# Patient Record
Sex: Female | Born: 1980 | Race: White | Hispanic: No | Marital: Married | Smoking: Former smoker
Health system: Southern US, Community
[De-identification: ages and names within clinical notes are randomized; demographics above are authoritative.]

## PROBLEM LIST (undated history)

## (undated) ENCOUNTER — Inpatient Hospital Stay: Payer: Self-pay

## (undated) DIAGNOSIS — R87629 Unspecified abnormal cytological findings in specimens from vagina: Secondary | ICD-10-CM

## (undated) HISTORY — DX: Unspecified abnormal cytological findings in specimens from vagina: R87.629

## (undated) HISTORY — PX: FRACTURE SURGERY: SHX138

## (undated) HISTORY — PX: WISDOM TOOTH EXTRACTION: SHX21

---
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
## 2002-11-24 ENCOUNTER — Ambulatory Visit (HOSPITAL_BASED_OUTPATIENT_CLINIC_OR_DEPARTMENT_OTHER): Admission: RE | Admit: 2002-11-24 | Discharge: 2002-11-24 | Payer: Self-pay | Admitting: Obstetrics and Gynecology

## 2005-06-04 ENCOUNTER — Ambulatory Visit: Payer: Self-pay | Admitting: Family Medicine

## 2007-01-03 ENCOUNTER — Ambulatory Visit: Payer: Self-pay | Admitting: Internal Medicine

## 2007-01-24 ENCOUNTER — Ambulatory Visit: Payer: Self-pay | Admitting: Family Medicine

## 2007-01-31 ENCOUNTER — Telehealth (INDEPENDENT_AMBULATORY_CARE_PROVIDER_SITE_OTHER): Payer: Self-pay | Admitting: *Deleted

## 2007-01-31 ENCOUNTER — Emergency Department (HOSPITAL_COMMUNITY): Admission: EM | Admit: 2007-01-31 | Discharge: 2007-01-31 | Payer: Self-pay | Admitting: Family Medicine

## 2007-06-26 ENCOUNTER — Emergency Department (HOSPITAL_COMMUNITY): Admission: EM | Admit: 2007-06-26 | Discharge: 2007-06-26 | Payer: Self-pay | Admitting: Emergency Medicine

## 2007-07-22 ENCOUNTER — Ambulatory Visit: Payer: Self-pay | Admitting: Family Medicine

## 2007-07-27 ENCOUNTER — Telehealth (INDEPENDENT_AMBULATORY_CARE_PROVIDER_SITE_OTHER): Payer: Self-pay | Admitting: Internal Medicine

## 2007-09-07 ENCOUNTER — Ambulatory Visit: Payer: Self-pay | Admitting: Family Medicine

## 2007-09-12 ENCOUNTER — Encounter: Payer: Self-pay | Admitting: Family Medicine

## 2007-09-27 ENCOUNTER — Ambulatory Visit: Payer: Self-pay | Admitting: Family Medicine

## 2007-09-28 ENCOUNTER — Encounter: Payer: Self-pay | Admitting: Family Medicine

## 2008-07-13 ENCOUNTER — Ambulatory Visit: Payer: Self-pay | Admitting: Family Medicine

## 2008-08-10 ENCOUNTER — Ambulatory Visit: Payer: Self-pay | Admitting: Internal Medicine

## 2008-08-21 ENCOUNTER — Ambulatory Visit: Payer: Self-pay | Admitting: Family Medicine

## 2008-09-10 ENCOUNTER — Telehealth: Payer: Self-pay | Admitting: Family Medicine

## 2008-09-20 ENCOUNTER — Ambulatory Visit: Payer: Self-pay | Admitting: Family Medicine

## 2008-09-20 LAB — CONVERTED CEMR LAB
Ketones, urine, test strip: NEGATIVE
Nitrite: NEGATIVE
Protein, U semiquant: 30

## 2009-09-20 ENCOUNTER — Emergency Department (HOSPITAL_COMMUNITY): Admission: EM | Admit: 2009-09-20 | Discharge: 2009-09-20 | Payer: Self-pay | Admitting: Emergency Medicine

## 2009-12-31 ENCOUNTER — Ambulatory Visit: Payer: Self-pay | Admitting: Family Medicine

## 2010-03-12 ENCOUNTER — Emergency Department (HOSPITAL_COMMUNITY): Admission: EM | Admit: 2010-03-12 | Discharge: 2010-03-12 | Payer: Self-pay | Admitting: Emergency Medicine

## 2010-07-15 ENCOUNTER — Encounter: Payer: Self-pay | Admitting: Family Medicine

## 2010-07-15 ENCOUNTER — Ambulatory Visit
Admission: RE | Admit: 2010-07-15 | Discharge: 2010-07-15 | Payer: Self-pay | Source: Home / Self Care | Attending: Family Medicine | Admitting: Family Medicine

## 2010-07-15 LAB — CONVERTED CEMR LAB: Rapid Strep: NEGATIVE

## 2010-07-15 NOTE — Assessment & Plan Note (Signed)
Summary: possible strep throat/jbb   Vital Signs:  Patient Profile:   30 Years Old Female CC:      Sore Throat / rwt Height:     66 inches Weight:      152 pounds BMI:     24.62 O2 Sat:      100 % O2 treatment:    Room Air Temp:     97.6 degrees F oral Pulse rate:   63 / minute Pulse rhythm:   regular Resp:     18 per minute BP sitting:   133 / 72  (right arm)  Pt. in pain?   yes    Location:   neck    Intensity:   4    Type:       stinging  Vitals Entered By: Levonne Spiller EMT-P (December 31, 2009 2:27 PM)              Is Patient Diabetic? No Comments Pt. is a smoker.  1 pack per week.      Current Allergies: ! * Z-PACK  History of Present Illness History from: patient Reason for visit: see chief complaint Chief Complaint: Sore Throat / rwt History of Present Illness: For 1-2 days patient has been complaining of a sore throat[place] be [place] down her throat. She denies drainage now. No cough, other than smoker's cough. No f/c, rhinorrhea.  REVIEW OF SYSTEMS Constitutional Symptoms      Denies fever, chills, night sweats, weight loss, weight gain, and fatigue.  Ear/Nose/Throat/Mouth       Complains of sore throat.      Denies ear pain, dizziness, frequent runny nose, frequent nose bleeds, sinus problems, and hoarseness.  Respiratory       Complains of productive cough.      Denies dry cough, wheezing, and shortness of breath.     Neurological       Denies headaches.    Social History: Marital Status:single Children:  Occupation: Runner, broadcasting/film/video Physical Exam General appearance: well developed, well nourished, no acute distress Oral/Pharynx: pharyngeal erythema with exudate, uvula midline without deviation + tonsillar enlargement bilaterally with erythema Neck: neck supple,  trachea midline, no masses, shotty lymphadenopathy (nontender) Chest/Lungs: no rales, wheezes, or rhonchi bilateral, breath sounds equal without  effort MSE: oriented to time, place, and person Assessment New Problems: PHARYNGITIS-ACUTE (ICD-462)   The patient and/or caregiver has been counseled thoroughly with regard to medications prescribed including dosage, schedule, interactions, rationale for use, and possible side effects and they verbalize understanding.  Diagnoses and expected course of recovery discussed and will return if not improved as expected or if the condition worsens. Patient and/or caregiver verbalized understanding.   Patient Instructions: 1)  For sore throat you may try... 2)  1- Ibuprofen or acetaminophen in liquid form 3)  2- warm salt water gargles - 1 tsp salt in quart of warm water 4)  3- eat smooth, wet, slippery foods such as ice cream, or jello. 5)  4- drinking herbal tea - ex. "Throat coat" 6)  Stay away from pain relief sprays and lozenges that contain medicines like benzocaine, ex. Chloraseptic, as they sometimes cause worsening pain after use. 7)  If severe - gargle with 25mg  of diphenhydramine (Benadryl) up to 4x/day or Maalox 10ml before meals  Orders Added: 1)  Est. Patient Level III [18841] 2)  Rapid Strep [66063]  The risks, benefits and possible side effects of the treatments and  tests were explained clearly to the patient and the patient verbalized understanding.  The patient was informed that there is no on-call provider or services available at this clinic during off-hours (when the clinic is closed).  If the patient developed a problem or concern that required immediate attention, the patient was advised to go the the nearest available urgent care or emergency department for medical care.  The patient verbalized understanding.

## 2010-07-23 ENCOUNTER — Ambulatory Visit (INDEPENDENT_AMBULATORY_CARE_PROVIDER_SITE_OTHER): Payer: BC Managed Care – PPO | Admitting: Family Medicine

## 2010-07-23 ENCOUNTER — Encounter: Payer: Self-pay | Admitting: Family Medicine

## 2010-07-23 DIAGNOSIS — J069 Acute upper respiratory infection, unspecified: Secondary | ICD-10-CM

## 2010-07-23 DIAGNOSIS — Z87891 Personal history of nicotine dependence: Secondary | ICD-10-CM | POA: Insufficient documentation

## 2010-07-23 NOTE — Assessment & Plan Note (Signed)
Summary: Natalie Ray patient/strep/rbh   Vital Signs:  Patient profile:   30 year old female Height:      66 inches Weight:      170.75 pounds BMI:     27.66 Temp:     98.4 degrees F oral Pulse rate:   84 / minute Pulse rhythm:   regular BP sitting:   102 / 62  (left arm) Cuff size:   regular  Vitals Entered By: Delilah Shan CMA Awab Abebe Dull) (July 15, 2010 8:44 AM) CC: ? Strep   History of Present Illness: Cold symptoms.  Started yesterday.  Came home from school yesterday.  ST.  Fatigued.  Post nasal gtt.  neck is sore from swallowing.  Taking tylenol cold and sinus (some temp relief), mucinex, and advil.  Chloroseptic and cough drops have helped some.  Recent sick contacts with strep.   Fever last night with chills alternated.  No cough.    Allergies: 1)  ! * Z-Pack  Past History:  Social History: Last updated: 07/15/2010 Marital Status:single Children: no Occupation: Runner, broadcasting/film/video, Barrister's clerk, [employer] grad enjoys rodeos, hunting, coaches volleyball quit smoking 2012 occ alcohol on weekends no illicits  Past Medical History: otherwise healthy Dr. Elana Alm for gyn care  Family History: Reviewed history from 09/12/2007 and no changes required. Father:alive DM Mother: alive Siblings: 1 brother, healthy  Social History: Reviewed history from 12/31/2009 and no changes required. Marital Status:single Children: no Occupation: Runner, broadcasting/film/video, Barrister's clerk, [employer] grad enjoys rodeos, hunting, coaches volleyball quit smoking 2012 occ alcohol on weekends no illicits  Review of Systems       See HPI.  Otherwise negative.    Physical Exam  General:  GEN: nad, alert and oriented HEENT: mucous membranes moist, TM w/o erythema, nasal epithelium minimally injected, OP with cobblestoning but no erythema/exudates NECK: supple w/o LA, no mass CV: rrr. PULM: ctab, no inc wob EXT: no edema    Impression & Recommendations:  Problem # 1:  PHARYNGITIS-ACUTE  (ICD-462) likely viral.  Supportive tx.  Nontoxic.  RST neg.  Okay to follow up as needed.  d/w patient and she agrees.  note for work given.   The following medications were removed from the medication list:    Septra Ds 800-160 Mg Tabs (Sulfamethoxazole-trimethoprim) .Marland Kitchen... 1 two times a day x 5days  Complete Medication List: 1)  Multivitamins Tabs (Multiple vitamin) .... Take 1 tablet by mouth once a day 2)  Sprintec 28 0.25-35 Mg-mcg Tabs (Norgestimate-eth estradiol) .... As directed  Other Orders: Rapid Strep (16109)  Patient Instructions: 1)  Get plenty of rest, drink lots of clear liquids, and use Tylenol or Ibuprofen for fever and comfort. This should graually improve.  Gargle with warm salt water and try to rest your voice.  Back to work when fever resolved.    Orders Added: 1)  Rapid Strep [60454] 2)  Est. Patient Level III [09811]    Current Allergies (reviewed today): ! * Z-PACK  Laboratory Results  Date/Time Received: July 15, 2010 8:51 AM   Other Tests  Rapid Strep: negative

## 2010-07-23 NOTE — Letter (Signed)
Summary: Out of Work  [facility]  [address] [place], [place] [address]   Phone: [phone]  Fax: [phone]    July 15, 2010   Employee:  Rosiland Oz    To Whom It May Concern:   For Medical reasons, please excuse the above named employee from work for the following dates:  Start:   07/14/10  End:   Return to work when fever and sore throat resolved, potentially contagious.   If you need additional information, please feel free to contact our office.         Sincerely,    Crawford Givens MD
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
## 2010-10-31 NOTE — Op Note (Signed)
   Natalie Ray, Natalie Ray                         ACCOUNT NO.:  [id number]   MEDICAL RECORD NO.:  [id number]                   PATIENT TYPE:  AMB   LOCATION:  [facility]                                 FACILITY:  [facility]   PHYSICIAN:  Katherine Roan, M.D.               DATE OF BIRTH:  1981/01/04   DATE OF PROCEDURE:  DATE OF DISCHARGE:                                 OPERATIVE REPORT   PREOPERATIVE DIAGNOSIS:  Moderate dysplasia of endocervix.   POSTOPERATIVE DIAGNOSIS:  Moderate dysplasia of endocervix.   OPERATION:  CO2 laser of cervix.   The patient was placed in lithotomy position, prepped and draped in the  usual fashion. The cervix was then washed with acetic acid and using the  Green filter, I was able to do endocervical and exocervical ablation of the  cervical tissue. The depth of invasion was 7 mm. [name] tolerated this  procedure well with minimal blood loss and was sent to the recovery room in  good condition.                                               Katherine Roan, M.D.    SDM/MEDQ  D:  11/24/2002  T:  11/25/2002  Job:  [id number]

## 2011-01-28 ENCOUNTER — Encounter: Payer: Self-pay | Admitting: *Deleted

## 2011-01-28 ENCOUNTER — Encounter: Payer: Self-pay | Admitting: Family Medicine

## 2011-01-28 ENCOUNTER — Ambulatory Visit (INDEPENDENT_AMBULATORY_CARE_PROVIDER_SITE_OTHER): Payer: BC Managed Care – PPO | Admitting: Family Medicine

## 2011-01-28 VITALS — BP 120/72 | HR 88 | Temp 97.8°F | Ht 68.0 in | Wt 169.8 lb

## 2011-01-28 DIAGNOSIS — B349 Viral infection, unspecified: Secondary | ICD-10-CM

## 2011-01-28 DIAGNOSIS — B9789 Other viral agents as the cause of diseases classified elsewhere: Secondary | ICD-10-CM

## 2011-01-28 DIAGNOSIS — J029 Acute pharyngitis, unspecified: Secondary | ICD-10-CM

## 2011-01-28 NOTE — Progress Notes (Signed)
  Subjective:    Patient ID: Natalie Ray, female    DOB: Jan 04, 1981, 30 y.o.   MRN: [id number]  HPI  Natalie Ray, a 30 y.o. 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 No n/v/d. No fever or chills. No headache.  Pt did have some Augmentin  From last year that she started on Monday  The PMH, PSH, Social History, Family History, Medications, and allergies have been reviewed in [facility], and have been updated if relevant.  Review of Systems ROS: GEN: Acute illness details above GI: Tolerating PO intake GU: maintaining adequate hydration and urination Pulm: No SOB Interactive and getting along well at home.  Otherwise, ROS is as per the HPI.     Objective:   Physical Exam   Physical Exam  Blood pressure 120/72, pulse 88, temperature 97.8 F (36.6 C), temperature source Oral, height 5\' 8"  (1.727 m), weight 169 lb 12.8 Ray (77.021 kg), SpO2 99.00%.  GEN: A and O x 3. WDWN. NAD.    ENT: Nose clear, ext NML.  No LAD.  No JVD.  TM's clear. Oropharynx clear.  PULM: Normal WOB, no distress. No crackles, wheezes, rhonchi. CV: RRR, no M/G/R, No rubs, No JVD.   ABD: S, NT, ND, + BS. No rebound. No guarding. No HSM.   EXT: warm and well-perfused, No c/c/e. PSYCH: Pleasant and conversant.       Assessment & Plan:   1. Viral syndrome    2. Sore throat  POCT rapid strep A, POCT rapid strep A   Strep neg Reassured. Supportive care reviewed with patient. Clinically c/w URI -- would stop Augmentin

## 2011-02-16 ENCOUNTER — Emergency Department (HOSPITAL_COMMUNITY)
Admission: EM | Admit: 2011-02-16 | Discharge: 2011-02-16 | Discharge status: Against Medical Advice | Payer: BC Managed Care – PPO | Attending: Emergency Medicine | Admitting: Emergency Medicine

## 2011-02-16 DIAGNOSIS — R51 Headache: Secondary | ICD-10-CM | POA: Insufficient documentation

## 2011-04-02 ENCOUNTER — Other Ambulatory Visit (HOSPITAL_COMMUNITY)
Admission: RE | Admit: 2011-04-02 | Discharge: 2011-04-02 | Disposition: A | Discharge status: Home / Self Care | Payer: BC Managed Care – PPO | Source: Ambulatory Visit | Attending: Family Medicine | Admitting: Family Medicine

## 2011-04-02 ENCOUNTER — Encounter: Payer: Self-pay | Admitting: Family Medicine

## 2011-04-02 ENCOUNTER — Ambulatory Visit (INDEPENDENT_AMBULATORY_CARE_PROVIDER_SITE_OTHER): Payer: BC Managed Care – PPO | Admitting: Family Medicine

## 2011-04-02 VITALS — BP 120/72 | HR 80 | Temp 98.2°F | Ht 67.0 in | Wt 173.8 lb

## 2011-04-02 DIAGNOSIS — Z0000 Encounter for general adult medical examination without abnormal findings: Secondary | ICD-10-CM

## 2011-04-02 DIAGNOSIS — Z01419 Encounter for gynecological examination (general) (routine) without abnormal findings: Secondary | ICD-10-CM

## 2011-04-02 DIAGNOSIS — Z113 Encounter for screening for infections with a predominantly sexual mode of transmission: Secondary | ICD-10-CM | POA: Insufficient documentation

## 2011-04-02 DIAGNOSIS — Z136 Encounter for screening for cardiovascular disorders: Secondary | ICD-10-CM

## 2011-04-02 MED ORDER — NORGESTIMATE-ETH ESTRADIOL 0.25-35 MG-MCG PO TABS: 1.0000 | ORAL_TABLET | Freq: Every day | ORAL | Status: DC

## 2011-04-02 NOTE — Patient Instructions (Signed)
Very nice to meet you. We will call you with results of your blood work and test results next week. Have a great weekend.
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
## 2011-04-03 LAB — LIPID PANEL
Cholesterol: 228 mg/dL — ABNORMAL HIGH (ref 0–200)
HDL: 60.1 mg/dL
Total CHOL/HDL Ratio: 4
Triglycerides: 181 mg/dL — ABNORMAL HIGH (ref 0.0–149.0)
VLDL: 36.2 mg/dL (ref 0.0–40.0)

## 2011-04-03 LAB — BASIC METABOLIC PANEL
BUN: 12 mg/dL (ref 6–23)
CO2: 26 mEq/L (ref 19–32)
Calcium: 9.2 mg/dL (ref 8.4–10.5)
Glucose, Bld: 81 mg/dL (ref 70–99)
Potassium: 4.6 mEq/L (ref 3.5–5.1)
Sodium: 139 mEq/L (ref 135–145)

## 2011-04-03 LAB — SYPHILIS: RPR W/REFLEX TO RPR TITER AND TREPONEMAL ANTIBODIES, TRADITIONAL SCREENING AND DIAGNOSIS ALGORITHM

## 2011-04-03 LAB — LDL CHOLESTEROL, DIRECT: Direct LDL: 149.6 mg/dL

## 2011-04-06 ENCOUNTER — Encounter: Payer: Self-pay | Admitting: *Deleted

## 2011-12-21 ENCOUNTER — Other Ambulatory Visit: Payer: Self-pay | Admitting: *Deleted

## 2011-12-21 MED ORDER — NORGESTIMATE-ETH ESTRADIOL 0.25-35 MG-MCG PO TABS: 1.0000 | ORAL_TABLET | Freq: Every day | ORAL | Status: DC

## 2012-04-08 ENCOUNTER — Other Ambulatory Visit: Payer: Self-pay | Admitting: Family Medicine

## 2012-05-23 ENCOUNTER — Telehealth: Payer: Self-pay | Admitting: Family Medicine

## 2012-05-23 ENCOUNTER — Ambulatory Visit (INDEPENDENT_AMBULATORY_CARE_PROVIDER_SITE_OTHER): Payer: BC Managed Care – PPO | Admitting: Family Medicine

## 2012-05-23 ENCOUNTER — Encounter: Payer: Self-pay | Admitting: Family Medicine

## 2012-05-23 VITALS — BP 102/72 | HR 63 | Temp 98.5°F | Wt 180.8 lb

## 2012-05-23 DIAGNOSIS — Z136 Encounter for screening for cardiovascular disorders: Secondary | ICD-10-CM

## 2012-05-23 DIAGNOSIS — Z0000 Encounter for general adult medical examination without abnormal findings: Secondary | ICD-10-CM

## 2012-05-23 DIAGNOSIS — R059 Cough, unspecified: Secondary | ICD-10-CM

## 2012-05-23 DIAGNOSIS — R05 Cough: Secondary | ICD-10-CM

## 2012-05-23 MED ORDER — ALBUTEROL SULFATE (2.5 MG/3ML) 0.083% IN NEBU
2.5000 mg | INHALATION_SOLUTION | Freq: Once | RESPIRATORY_TRACT | Status: AC
  Administered 2012-05-23: 2.5 mg via RESPIRATORY_TRACT

## 2012-05-23 MED ORDER — NORGESTIMATE-ETH ESTRADIOL 0.25-35 MG-MCG PO TABS: ORAL_TABLET | ORAL | Status: DC

## 2012-05-23 MED ORDER — ALBUTEROL SULFATE HFA 108 (90 BASE) MCG/ACT IN AERS: 1.0000 | INHALATION_SPRAY | Freq: Four times a day (QID) | RESPIRATORY_TRACT | Status: DC | PRN

## 2012-05-23 NOTE — Telephone Encounter (Signed)
Needs OCP refill.  Will defer to Dr Dayton Martes.  Thanks.

## 2012-05-23 NOTE — Telephone Encounter (Signed)
Rx for one month sent. Ok to refill for year from my standpoint as she is not yet due for another pap (had normal pap in October 2012).  May need CPX with primary.  Will defer to Dr. Para March for that.Marland Kitchen

## 2012-05-23 NOTE — Telephone Encounter (Signed)
Left message asking patient to call back

## 2012-05-23 NOTE — Telephone Encounter (Signed)
I am not her primary.  Hadn't seen [facility] in a while, pt identified Natalie Ray as main MD as Natalie Ray had done CPE.

## 2012-05-23 NOTE — Progress Notes (Signed)
duration of symptoms: 4-5 days.   Started with nasal congestion.  Smokes.   Voice is altered.  Some sputum in AM, none after taking mucinex about 9AM Rhinorrhea: no No fevers.   ear pain: no sore throat: yes, likely from cough Cough:yes Myalgias: yes, likely from the cough.   ROS: See HPI.  Otherwise negative.    Meds, vitals, and allergies reviewed.   GEN: nad, alert and oriented HEENT: mucous membranes moist, TM w/o erythema, nasal epithelium not injected, OP without cobblestoning NECK: supple w/o LA CV: rrr. PULM: ctab, no inc wob, dry cough noted, no wheeze EXT: no edema  SABA neb given in office.    Recheck ctab with dec in cough noted.

## 2012-05-23 NOTE — Telephone Encounter (Signed)
That is fine for me- please change PCP in the chart

## 2012-05-23 NOTE — Telephone Encounter (Signed)
That's odd- when you look at my note from October she identified as a Para March pt and we were just doing her pap. Jacki Cones, please add me as PCP and please have her come in for lab work at her convenience.  I will place those labs.

## 2012-05-23 NOTE — Telephone Encounter (Signed)
Spoke with patient.  She is asking to change from Dr. Milinda Antis to Dr. Dayton Martes. I advised her she will need to schedule get established visit with Dr. Dayton Martes.

## 2012-05-23 NOTE — Patient Instructions (Addendum)
Use the inhaler every 6 hours as needed.  Stay off the cigarettes.  Drink plenty of fluids, gargle with salt water, and use a throat lozenge through the day.  I would get a flu shot each fall.

## 2012-05-24 DIAGNOSIS — R05 Cough: Secondary | ICD-10-CM | POA: Insufficient documentation

## 2012-05-24 DIAGNOSIS — R059 Cough, unspecified: Secondary | ICD-10-CM | POA: Insufficient documentation

## 2012-05-24 NOTE — Assessment & Plan Note (Signed)
Likely viral, nontoxic, use SABA with routine instructions given and f/u prn.  Should resolve.

## 2012-05-25 NOTE — Telephone Encounter (Signed)
Left message asking patient to call back, PCP changed in chart.

## 2012-05-26 NOTE — Telephone Encounter (Signed)
Spoke with patient, appt made

## 2012-05-31 ENCOUNTER — Other Ambulatory Visit: Payer: Self-pay | Admitting: Family Medicine

## 2012-05-31 MED ORDER — NORGESTIMATE-ETH ESTRADIOL 0.25-35 MG-MCG PO TABS: ORAL_TABLET | ORAL | Status: DC

## 2012-05-31 NOTE — Telephone Encounter (Signed)
RX  For [employer] 30 sent to [facility] [address].

## 2012-06-13 ENCOUNTER — Encounter: Payer: Self-pay | Admitting: Family Medicine

## 2012-06-13 ENCOUNTER — Ambulatory Visit (INDEPENDENT_AMBULATORY_CARE_PROVIDER_SITE_OTHER): Payer: BC Managed Care – PPO | Admitting: Family Medicine

## 2012-06-13 VITALS — BP 144/80 | HR 98 | Temp 99.0°F | Wt 184.8 lb

## 2012-06-13 DIAGNOSIS — R05 Cough: Secondary | ICD-10-CM

## 2012-06-13 DIAGNOSIS — R059 Cough, unspecified: Secondary | ICD-10-CM

## 2012-06-13 MED ORDER — BENZONATATE 200 MG PO CAPS: 200.0000 mg | ORAL_CAPSULE | Freq: Three times a day (TID) | ORAL | Status: AC | PRN

## 2012-06-13 MED ORDER — HYDROCODONE-HOMATROPINE 5-1.5 MG/5ML PO SYRP: 5.0000 mL | ORAL_SOLUTION | Freq: Three times a day (TID) | ORAL | Status: DC | PRN

## 2012-06-13 NOTE — Patient Instructions (Addendum)
Tessalon and/or hycodan for cough.  Hycodan can make you drowsy.  Drink plenty of fluids, take tylenol as needed, and this should gradually improve.  Take care.  Let us know if you have other concerns.

## 2012-06-13 NOTE — Progress Notes (Signed)
Last note reviewed.  Since last OV, she did improve and felt well by the 20th.  Cough was much improved and was sleeping through the night.  Sx restarted on Saturday.  Inc in cough, fever noted at 101.  Hot and cold chills.  Sweats.  Aches all over, mainly on the trunk, likely from coughing.  Not stuffy but some rhinorrhea.  Cough is dry, comes on in fits.  Sleep is disrupted.  Waking up coughing.    Meds, vitals, and allergies reviewed.   ROS: See HPI.  Otherwise, noncontributory.  GEN: nad, alert and oriented HEENT: mucous membranes moist, tm w/o erythema, nasal exam w/o erythema, clear discharge noted,  OP with cobblestoning, sinuses not ttp NECK: supple w/o LA CV: rrr.   PULM: ctab but dry cough noted, no inc wob EXT: no edema SKIN: no acute rash

## 2012-06-14 ENCOUNTER — Telehealth: Payer: Self-pay | Admitting: Family Medicine

## 2012-06-14 MED ORDER — PROMETHAZINE HCL 25 MG PO TABS: 12.5000 mg | ORAL_TABLET | Freq: Three times a day (TID) | ORAL | Status: DC | PRN

## 2012-06-14 NOTE — Telephone Encounter (Signed)
Caller: Armanie/Patient; Phone: [phone]; Reason for Call:Had a message from [place] and verifying if she was calling to tell her the medication was called in. Reviewed EMR note in EPIC and relayed instructions per Dr. Para March.

## 2012-06-14 NOTE — Telephone Encounter (Signed)
Left message on voice mail  to call back

## 2012-06-14 NOTE — Telephone Encounter (Signed)
Hello,  Yesterday I had an office visit with Dr. Para March, Wanted to follow up on a couple of new symptoms since that visit. I had not eaten anything yesterday at the time of my visit, but after getting home and wanting to get something on my stomach to take the medication, it all came back up. I cannot keep anything but water and orange juice down. Also I have been checking my temperature throughout the night, due to the fact that sleep still eludes me, and it has fluctuated from 101 at its highest to just now at 6:30 am 100.4. Don't know if this would change anything about his diagnosis, or if there is some other medication that he might can prescribe to stop the nausea/vomiting. I cannot come in to the office again even though I am out of work again today. If there is any way some one could respond via email or phone it would be very appreciated.   Also, Dr Para March, the cough medications have helped so thank you for that.   Thanks again, [employer]

## 2012-06-14 NOTE — Assessment & Plan Note (Signed)
She has >48 hours of sx.  We talked about ddx.  Lungs are clear.  It doesn't appear that she has PNA or other condition that would benefit from abx treatment.  D/w pt.  She agrees.  She could have flu, but with relatively mild sx and benign exam with sx >48h, tamiflu would likely not be very useful so we didn't check flu test.  Likely viral, supportive care. Use tessalon and cough syrup, sedation caution.  F/u prn.  She agrees. Nontoxic.

## 2012-06-14 NOTE — Telephone Encounter (Signed)
Please call pt back.  Thanks for the update.   I would continue to try to take sips of fluids.  She doesn't have to eat solids, but staying hydrated is important.  I send in some phenergan for the nausea.  It can make her drowsy.   I'm glad the cough is better.  Let me know if she has other concerns.   Thanks.

## 2012-06-16 ENCOUNTER — Ambulatory Visit (INDEPENDENT_AMBULATORY_CARE_PROVIDER_SITE_OTHER): Payer: BC Managed Care – PPO | Admitting: Family Medicine

## 2012-06-16 ENCOUNTER — Encounter: Payer: Self-pay | Admitting: Family Medicine

## 2012-06-16 VITALS — BP 110/80 | HR 84 | Temp 98.1°F | Ht 67.5 in | Wt 183.0 lb

## 2012-06-16 DIAGNOSIS — R05 Cough: Secondary | ICD-10-CM

## 2012-06-16 DIAGNOSIS — Z0000 Encounter for general adult medical examination without abnormal findings: Secondary | ICD-10-CM

## 2012-06-16 DIAGNOSIS — Z113 Encounter for screening for infections with a predominantly sexual mode of transmission: Secondary | ICD-10-CM

## 2012-06-16 DIAGNOSIS — R059 Cough, unspecified: Secondary | ICD-10-CM

## 2012-06-16 DIAGNOSIS — Z23 Encounter for immunization: Secondary | ICD-10-CM

## 2012-06-16 MED ORDER — NORGESTIMATE-ETH ESTRADIOL 0.25-35 MG-MCG PO TABS: ORAL_TABLET | ORAL | Status: DC

## 2012-06-16 NOTE — Addendum Note (Signed)
Addended by: Eliezer Bottom on: 06/16/2012 09:59 AM   Modules accepted: Orders
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
## 2012-10-21 ENCOUNTER — Other Ambulatory Visit: Payer: Self-pay | Admitting: *Deleted

## 2012-10-21 MED ORDER — NORGESTIMATE-ETH ESTRADIOL 0.25-35 MG-MCG PO TABS: ORAL_TABLET | ORAL | Status: DC

## 2013-01-10 ENCOUNTER — Telehealth: Payer: Self-pay

## 2013-01-10 NOTE — Telephone Encounter (Signed)
Pt left v/m requesting advice on how to take birth control med; pt is planning to get married in November and will be at end of menstrual cycle on day of wedding. Pt plans to skip her menstrual period this month by not taking the week of placebo pills; pt wants to know if she should skip the placebo pills this month only or should she skip placebo pills for 3 months. Pt wants to move her period up one week in November so she will have her menstrual cycle the week before she gets married.Please advise.

## 2013-01-10 NOTE — Telephone Encounter (Signed)
Just skip the placebo this month if she is trying to change when she has her period every month.

## 2013-01-11 NOTE — Telephone Encounter (Signed)
Advised patient as instructed. 

## 2013-02-03 ENCOUNTER — Telehealth: Payer: Self-pay

## 2013-02-03 MED ORDER — NORGESTIMATE-ETH ESTRADIOL 0.25-35 MG-MCG PO TABS: ORAL_TABLET | ORAL | Status: DC

## 2013-02-03 NOTE — Telephone Encounter (Signed)
Script mailed to patient.

## 2013-02-03 NOTE — Telephone Encounter (Signed)
Advised patient that we cant email her a script, will send in regular mail to her home address.

## 2013-02-03 NOTE — Telephone Encounter (Signed)
I am ok with this.

## 2013-02-03 NOTE — Telephone Encounter (Signed)
Dr Dayton Martes is my primary doctor. I am wanting to set up that my monthly prescription for birth control come through a 3 month mail in set up instead of monthly at [facility]. They are asking for a new Rx for the 3 month supply instead of the existing Rx that I have on order. Is there any way you could send me via email an Rx for the 3 month supply of my current birth control??  Thanks, Natalie Ray

## 2013-05-08 ENCOUNTER — Encounter: Payer: Self-pay | Admitting: Family Medicine

## 2013-05-08 ENCOUNTER — Ambulatory Visit (INDEPENDENT_AMBULATORY_CARE_PROVIDER_SITE_OTHER): Payer: 59 | Admitting: Family Medicine

## 2013-05-08 VITALS — BP 96/64 | HR 92 | Temp 98.2°F | Ht 67.5 in | Wt 182.5 lb

## 2013-05-08 DIAGNOSIS — R112 Nausea with vomiting, unspecified: Secondary | ICD-10-CM

## 2013-05-08 LAB — POCT URINE PREGNANCY: Preg Test, Ur: NEGATIVE

## 2013-05-08 MED ORDER — ONDANSETRON 8 MG PO TBDP: 8.0000 mg | ORAL_TABLET | Freq: Three times a day (TID) | ORAL | Status: DC | PRN

## 2013-05-08 MED ORDER — ONDANSETRON 4 MG PO TBDP
4.0000 mg | ORAL_TABLET | Freq: Once | ORAL | Status: AC
  Administered 2013-05-08: 4 mg via ORAL

## 2013-05-08 NOTE — Progress Notes (Signed)
Pre-visit discussion using our clinic review tool. No additional management support is needed unless otherwise documented below in the visit note.  

## 2013-05-08 NOTE — Progress Notes (Signed)
Date:  05/08/2013   Name:  Natalie Ray   DOB:  [birth year]   MRN:  [id number] Gender: female Age: 32 y.o.  Primary Physician:  Natalie Mannan, MD   Chief Complaint: Nausea and Emesis   Subjective:   History of Present Illness:  Natalie Ray is a 32 y.o. very pleasant female patient who presents with the following:  Felt fine yesterday, has been throwing up all night. Had some cold chills and sweats.  Just got married. Been throwing up for a lot of the last 24 hours. She did get to a concert on Saturday night. She has not had any diarrhea. She is not in acute abdominal pain and does not have a history of abdominal surgery.  She is sexually active, and her last menstrual period ended over the weekend.  Past Medical History, Surgical History, Social History, Family History, Problem List, Medications, and Allergies have been reviewed and updated if relevant.  Review of Systems:  GEN: no fevers, chills. GI: above  Pulm: No SOB Interactive and getting along well at home.  Otherwise, ROS is as per the HPI.  Objective:   Physical Examination: BP 96/64  Pulse 92  Temp(Src) 98.2 F (36.8 C) (Oral)  Ht 5' 7.5" (1.715 m)  Wt 182 lb 8 oz (82.781 kg)  BMI 28.15 kg/m2  LMP 05/03/2013   GEN: WDWN, NAD, Non-toxic, A & O x 3 HEENT: Atraumatic, Normocephalic. Neck supple. No masses, No LAD. Ears and Nose: No external deformity. CV: RRR, No M/G/R. No JVD. No thrill. No extra heart sounds. PULM: CTA B, no wheezes, crackles, rhonchi. No retractions. No resp. distress. No accessory muscle use. ABD, s, nt, nd, +BS, no hsm EXTR: No c/c/e NEURO Normal gait.  PSYCH: Normally interactive. Conversant. Not depressed or anxious appearing.  Calm demeanor.   Laboratory and Imaging Data: Results for orders placed in visit on 05/08/13  POCT URINE PREGNANCY      Result Value Range   Preg Test, Ur Negative       Assessment & Plan:    Nausea with vomiting - Plan: ondansetron  (ZOFRAN-ODT) disintegrating tablet 4 mg, POCT urine pregnancy  I reassured the patient, suspected this is likely a GI illness, viral GI bug. Keep hydrated.  There are no Patient Instructions on file for this visit.  Orders Today:  Orders Placed This Encounter  Procedures  . POCT urine pregnancy    New medications, updates to list, dose adjustments: Meds ordered this encounter  Medications  . ondansetron (ZOFRAN-ODT) 8 MG disintegrating tablet    Sig: Take 1 tablet (8 mg total) by mouth every 8 (eight) hours as needed for nausea or vomiting.    Dispense:  30 tablet    Refill:  0  . ondansetron (ZOFRAN-ODT) disintegrating tablet 4 mg    Sig:     Signed,  Anuhea Gassner T. Azlee Monforte, MD, CAQ Sports Medicine  [facility] [address] [place] [address] Phone: [phone] Fax: [phone]  Updated Complete Medication List:   Medication List       This list is accurate as of: 05/08/13  3:29 PM.  Always use your most recent med list.               multivitamin tablet  Take 1 tablet by mouth daily.     norgestimate-ethinyl estradiol 0.25-35 MG-MCG tablet  Commonly known as:  SPRINTEC 28  TAKE 1 TABLET BY MOUTH EVERY DAY     ondansetron  8 MG disintegrating tablet  Commonly known as:  ZOFRAN-ODT  Take 1 tablet (8 mg total) by mouth every 8 (eight) hours as needed for nausea or vomiting.

## 2013-11-12 ENCOUNTER — Other Ambulatory Visit: Payer: Self-pay | Admitting: Family Medicine

## 2013-12-13 ENCOUNTER — Other Ambulatory Visit: Payer: Self-pay | Admitting: Family Medicine

## 2013-12-13 NOTE — Telephone Encounter (Signed)
Spoke to pt and informed her CPE was required for additional refills. F/u sched and Rx refilled

## 2014-01-09 ENCOUNTER — Encounter: Payer: 59 | Admitting: Family Medicine

## 2014-01-09 ENCOUNTER — Other Ambulatory Visit: Payer: Self-pay | Admitting: *Deleted

## 2014-01-09 MED ORDER — NORGESTIMATE-ETH ESTRADIOL 0.25-35 MG-MCG PO TABS
ORAL_TABLET | ORAL | Status: DC
Start: 2014-01-09 — End: 2016-01-22

## 2014-01-09 NOTE — Telephone Encounter (Signed)
Pt came into office for CPE, but recently started period and had to reschedule. Ok to refill Rx per Dr Dayton MartesAron

## 2014-02-15 ENCOUNTER — Encounter: Payer: 59 | Admitting: Family Medicine

## 2014-03-13 ENCOUNTER — Ambulatory Visit (HOSPITAL_BASED_OUTPATIENT_CLINIC_OR_DEPARTMENT_OTHER)
Admission: RE | Admit: 2014-03-13 | Discharge: 2014-03-13 | Disposition: A | Discharge status: Home / Self Care | Payer: 59 | Source: Ambulatory Visit | Attending: Family Medicine | Admitting: Family Medicine

## 2014-03-13 ENCOUNTER — Encounter: Payer: Self-pay | Admitting: Family Medicine

## 2014-03-13 ENCOUNTER — Ambulatory Visit (INDEPENDENT_AMBULATORY_CARE_PROVIDER_SITE_OTHER): Payer: 59 | Admitting: Family Medicine

## 2014-03-13 VITALS — BP 102/58 | HR 98 | Temp 98.1°F | Ht 67.25 in | Wt 193.8 lb

## 2014-03-13 DIAGNOSIS — R1013 Epigastric pain: Secondary | ICD-10-CM | POA: Diagnosis present

## 2014-03-13 DIAGNOSIS — R143 Flatulence: Secondary | ICD-10-CM

## 2014-03-13 DIAGNOSIS — R1011 Right upper quadrant pain: Secondary | ICD-10-CM | POA: Insufficient documentation

## 2014-03-13 DIAGNOSIS — R141 Gas pain: Secondary | ICD-10-CM | POA: Diagnosis not present

## 2014-03-13 DIAGNOSIS — R142 Eructation: Secondary | ICD-10-CM

## 2014-03-13 DIAGNOSIS — R110 Nausea: Secondary | ICD-10-CM | POA: Diagnosis present

## 2014-03-13 LAB — CBC WITH DIFFERENTIAL/PLATELET
BASOS ABS: 0 10*3/uL (ref 0.0–0.1)
Basophils Relative: 0.5 % (ref 0.0–3.0)
EOS ABS: 0.3 10*3/uL (ref 0.0–0.7)
Eosinophils Relative: 3.1 % (ref 0.0–5.0)
HEMATOCRIT: 41.2 % (ref 36.0–46.0)
HEMOGLOBIN: 14 g/dL (ref 12.0–15.0)
LYMPHS ABS: 3 10*3/uL (ref 0.7–4.0)
Lymphocytes Relative: 29.4 % (ref 12.0–46.0)
MCHC: 33.9 g/dL (ref 30.0–36.0)
MCV: 87.4 fl (ref 78.0–100.0)
Monocytes Absolute: 0.5 10*3/uL (ref 0.1–1.0)
Monocytes Relative: 5 % (ref 3.0–12.0)
NEUTROS ABS: 6.4 10*3/uL (ref 1.4–7.7)
Neutrophils Relative %: 62 % (ref 43.0–77.0)
Platelets: 252 10*3/uL (ref 150.0–400.0)
RBC: 4.72 Mil/uL (ref 3.87–5.11)
RDW: 13.3 % (ref 11.5–15.5)
WBC: 10.3 10*3/uL (ref 4.0–10.5)

## 2014-03-13 LAB — COMPREHENSIVE METABOLIC PANEL
ALT: 23 U/L (ref 0–35)
AST: 20 U/L (ref 0–37)
Albumin: 4 g/dL (ref 3.5–5.2)
Alkaline Phosphatase: 50 U/L (ref 39–117)
BUN: 9 mg/dL (ref 6–23)
CO2: 28 mEq/L (ref 19–32)
CREATININE: 0.9 mg/dL (ref 0.4–1.2)
Calcium: 9.3 mg/dL (ref 8.4–10.5)
Chloride: 104 mEq/L (ref 96–112)
GFR: 73.91 mL/min (ref >60.00)
Glucose, Bld: 99 mg/dL (ref 70–99)
Potassium: 4.5 mEq/L (ref 3.5–5.1)
Sodium: 136 mEq/L (ref 135–145)
Total Bilirubin: 0.6 mg/dL (ref 0.2–1.2)
Total Protein: 7.2 g/dL (ref 6.0–8.3)

## 2014-03-13 LAB — H. PYLORI ANTIBODY, IGG: H PYLORI IGG: NEGATIVE

## 2014-03-13 LAB — LIPASE: Lipase: 24 U/L (ref 11.0–59.0)

## 2014-03-13 NOTE — Assessment & Plan Note (Signed)
New- ? Gallbladder etiology- stones/colic vs PUD. Will order ultrasound of abdomen to look at gall bladder. Also check labs today including CBC, lipase and h pylori. Advised starting prilosec 20 mg nightly. If symptoms do not improve and ultrasound unremarkable, will refer to GI for endoscopy. The patient indicates understanding of these issues and agrees with the plan.

## 2014-03-13 NOTE — Progress Notes (Signed)
Pre visit review using our clinic review tool, if applicable. No additional management support is needed unless otherwise documented below in the visit note. 
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
## 2014-11-01 ENCOUNTER — Encounter: Payer: Self-pay | Admitting: Primary Care

## 2014-11-01 ENCOUNTER — Ambulatory Visit (INDEPENDENT_AMBULATORY_CARE_PROVIDER_SITE_OTHER): Payer: 59 | Admitting: Primary Care

## 2014-11-01 VITALS — BP 100/64 | HR 62 | Temp 97.3°F | Ht 67.25 in | Wt 191.8 lb

## 2014-11-01 DIAGNOSIS — Z315 Encounter for genetic counseling: Secondary | ICD-10-CM

## 2014-11-01 DIAGNOSIS — R140 Abdominal distension (gaseous): Secondary | ICD-10-CM

## 2014-11-01 DIAGNOSIS — Z1379 Encounter for other screening for genetic and chromosomal anomalies: Secondary | ICD-10-CM

## 2014-11-01 LAB — CBC WITH DIFFERENTIAL/PLATELET
Basophils Absolute: 0.1 10*3/uL (ref 0.0–0.1)
Basophils Relative: 0.8 % (ref 0.0–3.0)
EOS ABS: 0.2 10*3/uL (ref 0.0–0.7)
EOS PCT: 2.1 % (ref 0.0–5.0)
HCT: 39.3 % (ref 36.0–46.0)
HEMOGLOBIN: 13.8 g/dL (ref 12.0–15.0)
LYMPHS ABS: 3 10*3/uL (ref 0.7–4.0)
Lymphocytes Relative: 32.6 % (ref 12.0–46.0)
MCHC: 35 g/dL (ref 30.0–36.0)
MCV: 84.6 fl (ref 78.0–100.0)
MONO ABS: 0.5 10*3/uL (ref 0.1–1.0)
Monocytes Relative: 5.6 % (ref 3.0–12.0)
NEUTROS PCT: 58.9 % (ref 43.0–77.0)
Neutro Abs: 5.5 10*3/uL (ref 1.4–7.7)
Platelets: 292 10*3/uL (ref 150.0–400.0)
RBC: 4.65 Mil/uL (ref 3.87–5.11)
RDW: 13 % (ref 11.5–15.5)
WBC: 9.3 10*3/uL (ref 4.0–10.5)

## 2014-11-01 LAB — COMPREHENSIVE METABOLIC PANEL
ALT: 31 U/L (ref 0–35)
AST: 24 U/L (ref 0–37)
Albumin: 4.1 g/dL (ref 3.5–5.2)
Alkaline Phosphatase: 57 U/L (ref 39–117)
BILIRUBIN TOTAL: 0.2 mg/dL (ref 0.2–1.2)
BUN: 10 mg/dL (ref 6–23)
CO2: 28 meq/L (ref 19–32)
CREATININE: 0.78 mg/dL (ref 0.40–1.20)
Calcium: 9.4 mg/dL (ref 8.4–10.5)
Chloride: 104 mEq/L (ref 96–112)
GFR: 91.29 mL/min (ref >60.00)
GLUCOSE: 86 mg/dL (ref 70–99)
Potassium: 3.9 mEq/L (ref 3.5–5.1)
Sodium: 137 mEq/L (ref 135–145)
TOTAL PROTEIN: 6.9 g/dL (ref 6.0–8.3)

## 2014-11-01 LAB — POCT URINE PREGNANCY: Preg Test, Ur: NEGATIVE

## 2014-11-01 MED ORDER — METOCLOPRAMIDE HCL 10 MG/10ML PO SOLN: 10.0000 mg | Freq: Three times a day (TID) | ORAL | Status: DC

## 2014-11-01 NOTE — Patient Instructions (Addendum)
Your urine test was negative for pregnancy. Start Metoclopramide solution. You will take 10 mg four times daily; 30 minutes before each meal, and at bedtime. You will end up taking a total of 40 mg daily. Continue taking Famotidine as directed. Complete lab work prior to leaving today. I will notify you of your results. You will be contacted regarding your referral for genetic testing. Please let us know if you have not heard back within one week.   Follow up in 1 month or sooner if symptoms worsen.  Gastroparesis  Gastroparesis is also called slowed stomach emptying (delayed gastric emptying). It is a condition in which the stomach takes too long to empty its contents. It often happens in people with diabetes.  CAUSES  Gastroparesis happens when nerves to the stomach are damaged or stop working. When the nerves are damaged, the muscles of the stomach and intestines do not work normally. The movement of food is slowed or stopped. High blood glucose (sugar) causes changes in nerves and can damage the blood vessels that carry oxygen and nutrients to the nerves. RISK FACTORS  Diabetes.  Post-viral syndromes.  Eating disorders (anorexia, bulimia).  Surgery on the stomach or vagus nerve.  Gastroesophageal reflux disease (rarely).  Smooth muscle disorders (amyloidosis, scleroderma).  Metabolic disorders, including hypothyroidism.  Parkinson disease. SYMPTOMS   Heartburn.  Feeling sick to your stomach (nausea).  Vomiting of undigested food.  An early feeling of fullness when eating.  Weight loss.  Abdominal bloating.  Erratic blood glucose levels.  Lack of appetite.  Gastroesophageal reflux.  Spasms of the stomach wall. Complications can include:  Bacterial overgrowth in stomach. Food stays in the stomach and can ferment and cause bacteria to grow.  Weight loss due to difficulty digesting and absorbing nutrients.  Vomiting.  Obstruction in the stomach. Undigested  food can harden and cause nausea and vomiting.  Blood glucose fluctuations caused by inconsistent food absorption. DIAGNOSIS  The diagnosis of gastroparesis is confirmed through one or more of the following tests:  Barium X-rays and scans. These tests look at how long it takes for food to move through the stomach.  Gastric manometry. This test measures electrical and muscular activity in the stomach. A thin tube is passed down the throat into the stomach. The tube contains a wire that takes measurements of the stomach's electrical and muscular activity as it digests liquids and solid food.  Endoscopy. This procedure is done with a long, thin tube called an endoscope. It is passed through the mouth and gently down the esophagus into the stomach. This tube helps the caregiver look at the lining of the stomach to check for any abnormalities.  Ultrasonography. This can rule out gallbladder disease or pancreatitis. This test will outline and define the shape of the gallbladder and pancreas. TREATMENT   Treatments may include:  Exercise.  Medicines to control nausea and vomiting.  Medicines to stimulate stomach muscles.  Changes in what and when you eat.  Having smaller meals more often.  Eating low-fiber forms of high-fiber foods, such as eating cooked vegetables instead of raw vegetables.  Eating low-fat foods.  Consuming liquids, which are easier to digest.  In severe cases, feeding tubes and intravenous (IV) feeding may be needed. It is important to note that in most cases, treatment does not cure gastroparesis. It is usually a lasting (chronic) condition. Treatment helps you manage the underlying condition so that you can be as healthy and comfortable as possible. Other treatments  A  gastric neurostimulator has been developed to assist people with gastroparesis. The battery-operated device is surgically implanted. It emits mild electrical pulses to help improve stomach emptying  and to control nausea and vomiting.  The use of botulinum toxin has been shown to improve stomach emptying by decreasing the prolonged contractions of the muscle between the stomach and the small intestine (pyloric sphincter). The benefits are temporary. SEEK MEDICAL CARE IF:   You have diabetes and you are having problems keeping your blood glucose in goal range.  You are having nausea, vomiting, bloating, or early feelings of fullness with eating.  Your symptoms do not change with a change in diet. Document Released: 06/01/2005 Document Revised: 09/26/2012 Document Reviewed: 11/08/2008 [facility] Patient Information 2015 [employer] [place]. This information is not intended to replace advice given to you by your health care provider. Make sure you discuss any questions you have with your health care provider.

## 2014-11-01 NOTE — Progress Notes (Signed)
Pre visit review using our clinic review tool, if applicable. No additional management support is needed unless otherwise documented below in the visit note. 
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
## 2014-11-04 ENCOUNTER — Telehealth: Payer: Self-pay | Admitting: Primary Care

## 2014-11-04 NOTE — Telephone Encounter (Signed)
Please notify Natalie Ray that our referral coordinator believes the best way to obtain the genetic testing that she is requesting is to discuss her concerns with her OB/GYN rather than a genetics specialist. She will need to contact her GYN.  Thank you.

## 2014-11-05 NOTE — Telephone Encounter (Signed)
Called and notified patient of Kate's comments. Patient verbalized understanding.  

## 2014-12-06 ENCOUNTER — Ambulatory Visit: Payer: 59 | Admitting: Primary Care

## 2015-03-22 IMAGING — US US ABDOMEN COMPLETE
1 series · 14 of 25 positions shown · non-contrast
Comparison: None.

CLINICAL DATA: Epigastric pain and nausea

EXAM:
ULTRASOUND ABDOMEN COMPLETE

[Series 1: us abdomen complete · 0.27mm/px · 14 of 56 slices shown]
[im 1/56]
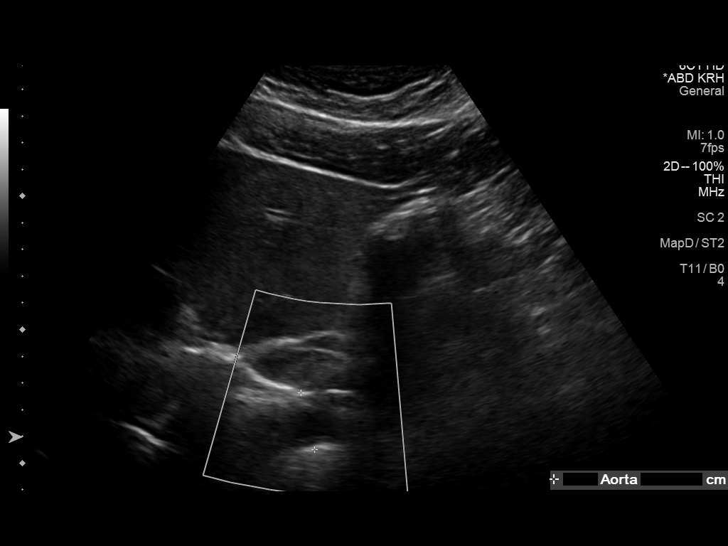
[im 5/56]
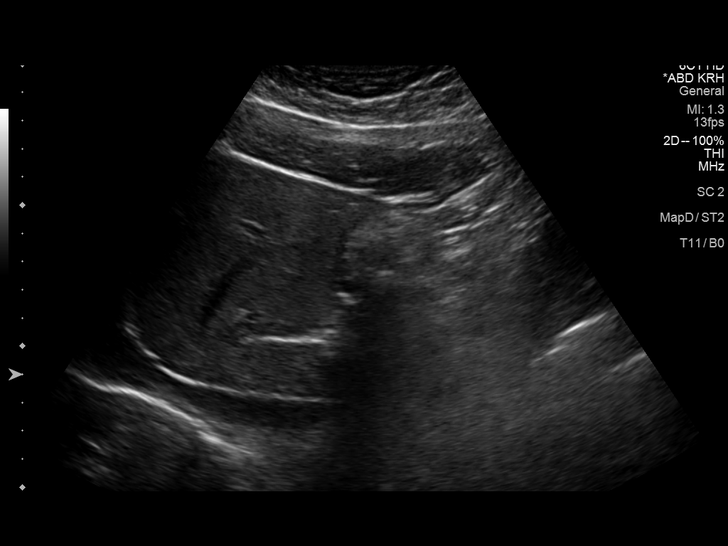
[im 10/56]
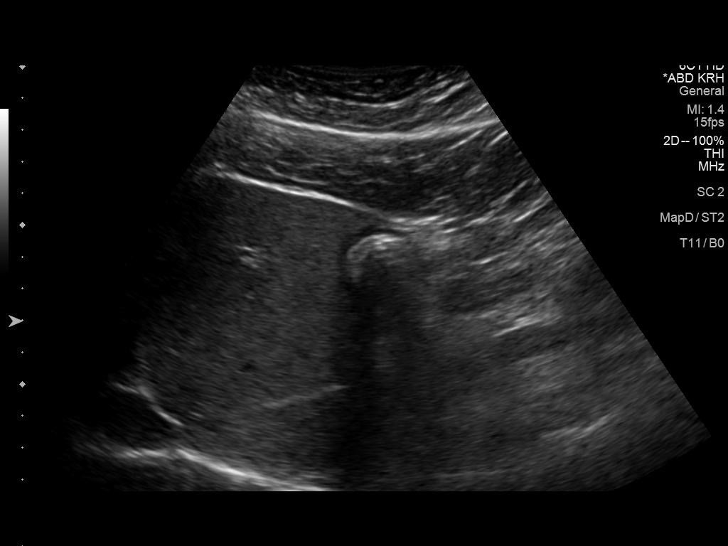
[im 14/56]
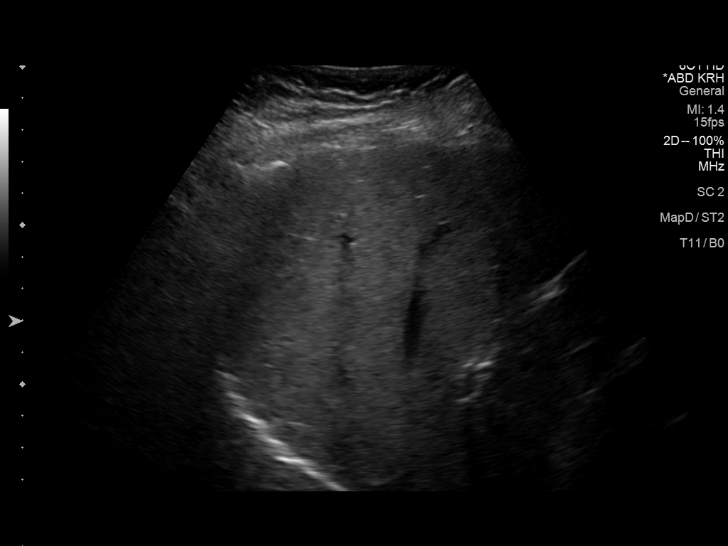
[im 19/56]
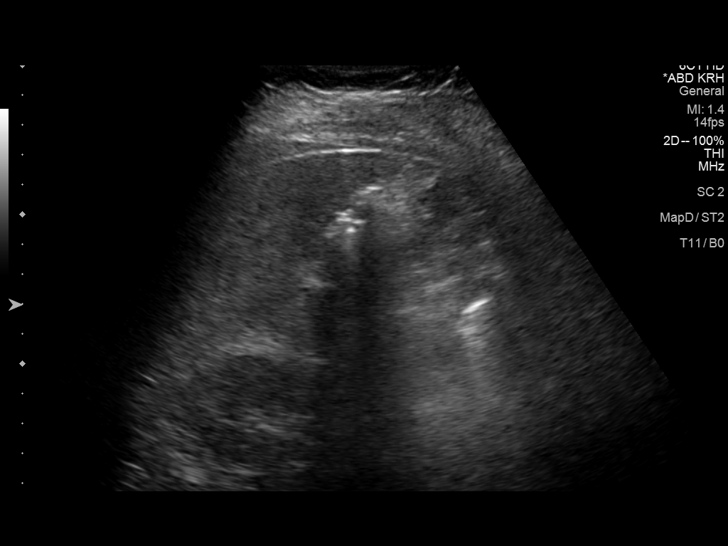
[im 21/56]
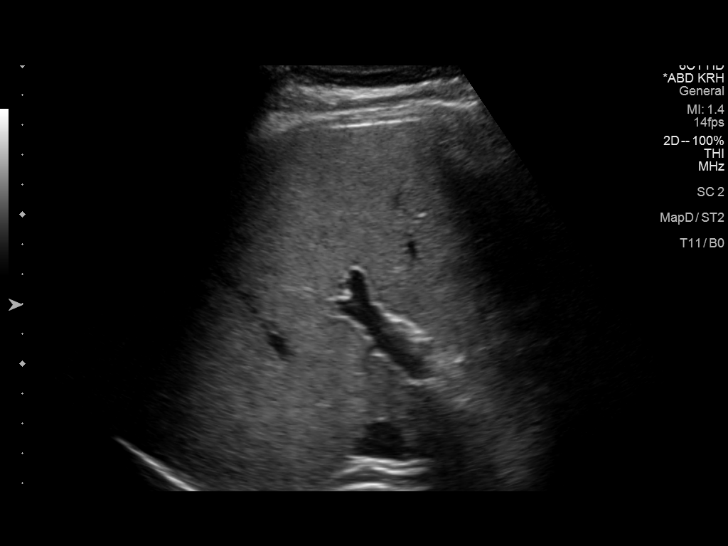
[im 26/56]
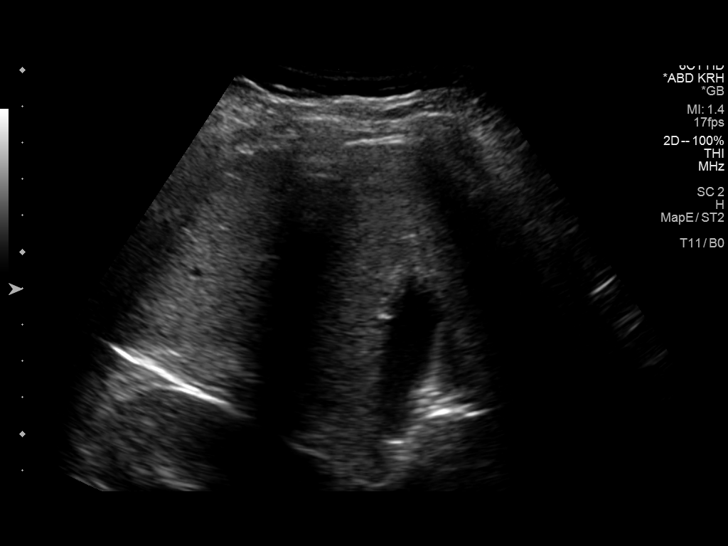
[im 30/56]
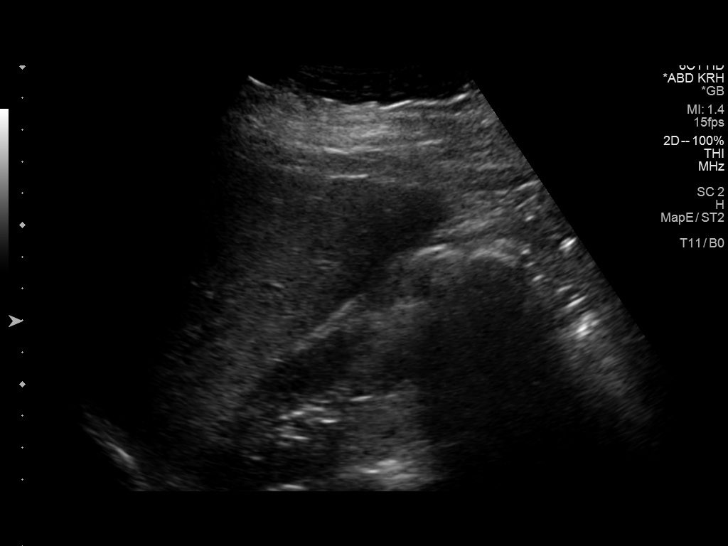
[im 35/56]
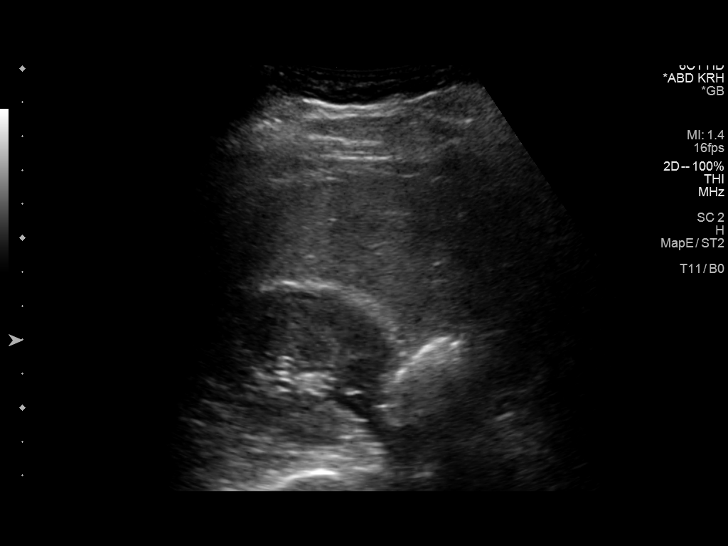
[im 37/56]
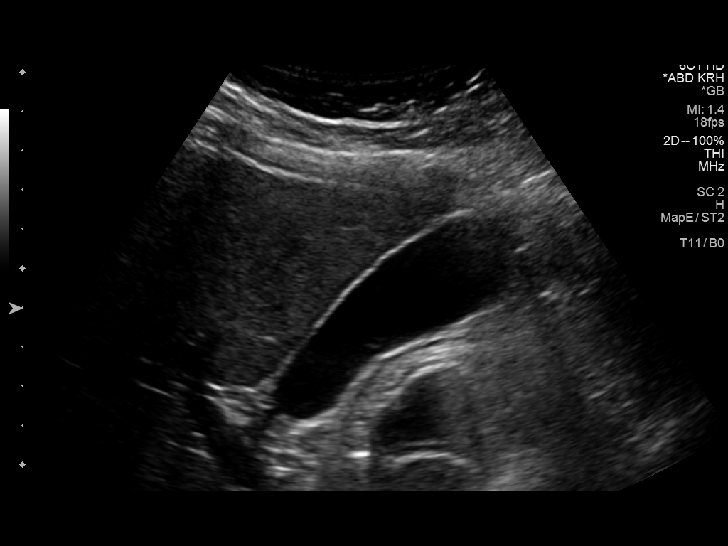
[im 42/56]
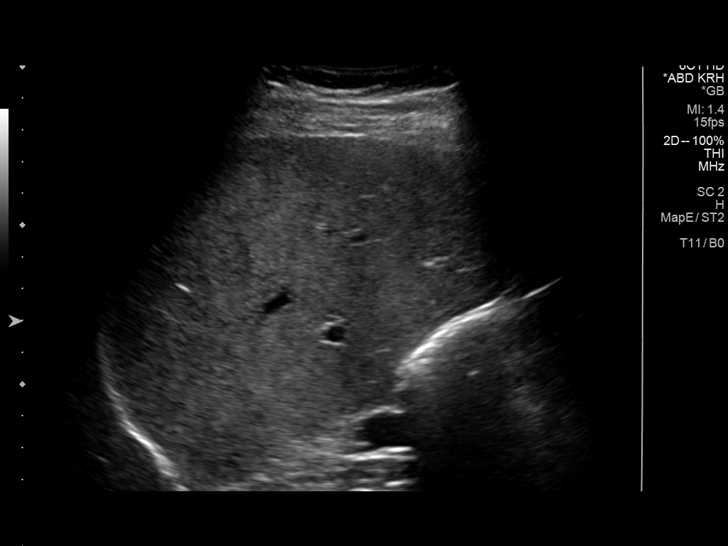
[im 46/56]
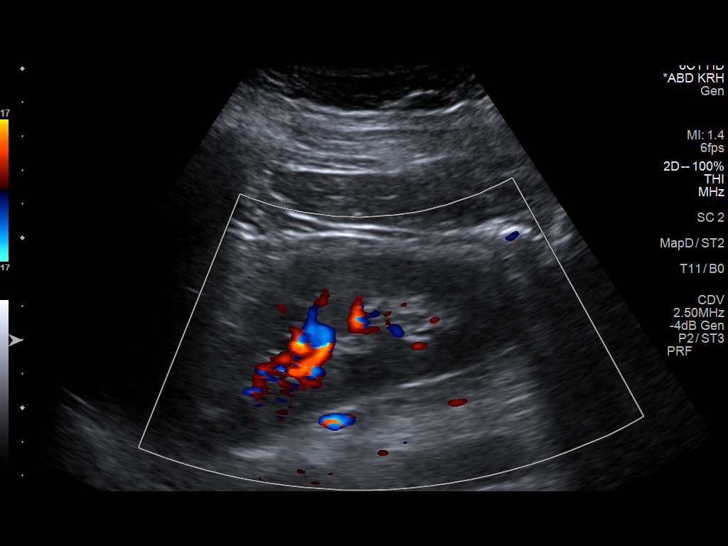
[im 51/56]
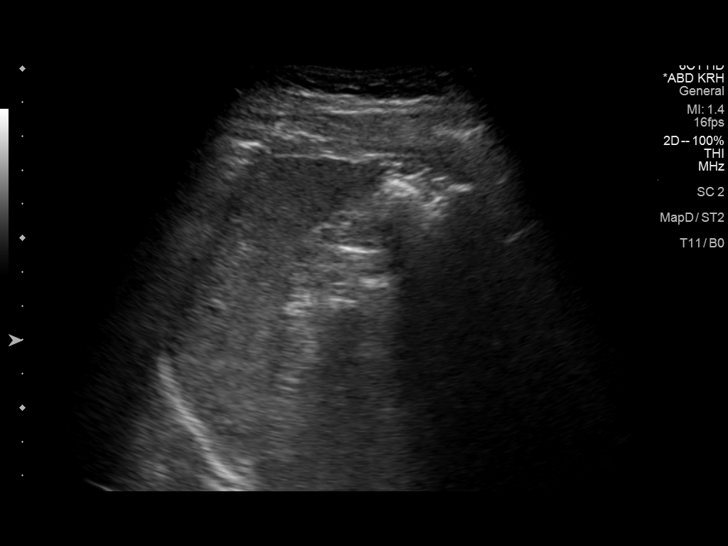
[im 56/56]
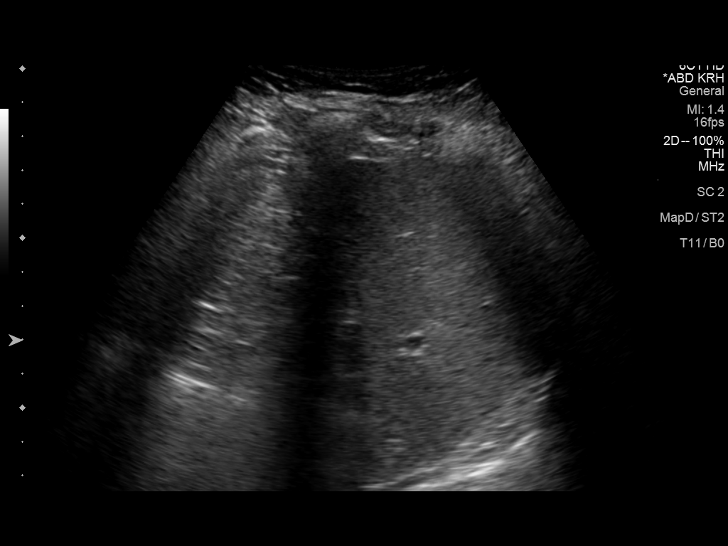

[14 of 25 positions shown; findings below may reference images not displayed]

FINDINGS: Gallbladder:

No gallstones or wall thickening visualized. No sonographic Murphy
sign noted.

Common bile duct:

Diameter: Normal at 3 mm

Liver:

No focal lesion identified. Within normal limits in parenchymal
echogenicity.

IVC:

No abnormality visualized.

Pancreas:

Visualized portion unremarkable.

Spleen:

Size and appearance within normal limits.

Right Kidney:

Length: 10.9 cm. Echogenicity within normal limits. No mass or
hydronephrosis visualized.

Left Kidney:

Length: 11.1 cm. Echogenicity within normal limits. No mass or
hydronephrosis visualized.

Abdominal aorta:

No aneurysm visualized.

Other findings:

No ascites
IMPRESSION: 1. Normal gallbladder.
2. No abdominal abnormality by ultrasound.

## 2015-06-04 ENCOUNTER — Other Ambulatory Visit (HOSPITAL_COMMUNITY)
Admission: RE | Admit: 2015-06-04 | Discharge: 2015-06-04 | Disposition: A | Discharge status: Home / Self Care | Payer: 59 | Source: Ambulatory Visit | Attending: Family Medicine | Admitting: Family Medicine

## 2015-06-04 ENCOUNTER — Ambulatory Visit (INDEPENDENT_AMBULATORY_CARE_PROVIDER_SITE_OTHER): Payer: 59 | Admitting: Family Medicine

## 2015-06-04 ENCOUNTER — Encounter: Payer: Self-pay | Admitting: *Deleted

## 2015-06-04 ENCOUNTER — Encounter: Payer: Self-pay | Admitting: Family Medicine

## 2015-06-04 VITALS — BP 112/68 | HR 76 | Temp 97.8°F | Ht 67.0 in | Wt 207.8 lb

## 2015-06-04 DIAGNOSIS — Z1151 Encounter for screening for human papillomavirus (HPV): Secondary | ICD-10-CM | POA: Insufficient documentation

## 2015-06-04 DIAGNOSIS — Z0000 Encounter for general adult medical examination without abnormal findings: Secondary | ICD-10-CM | POA: Diagnosis not present

## 2015-06-04 DIAGNOSIS — Z01419 Encounter for gynecological examination (general) (routine) without abnormal findings: Secondary | ICD-10-CM | POA: Insufficient documentation

## 2015-06-04 DIAGNOSIS — Z319 Encounter for procreative management, unspecified: Secondary | ICD-10-CM

## 2015-06-04 DIAGNOSIS — Z113 Encounter for screening for infections with a predominantly sexual mode of transmission: Secondary | ICD-10-CM

## 2015-06-04 DIAGNOSIS — Z124 Encounter for screening for malignant neoplasm of cervix: Secondary | ICD-10-CM

## 2015-06-04 DIAGNOSIS — Z23 Encounter for immunization: Secondary | ICD-10-CM

## 2015-06-04 LAB — LIPID PANEL
Cholesterol: 232 mg/dL — ABNORMAL HIGH (ref 0–200)
HDL: 51.9 mg/dL (ref >39.00)
LDL Cholesterol: 156 mg/dL — ABNORMAL HIGH (ref 0–99)
NONHDL: 180.07
TRIGLYCERIDES: 118 mg/dL (ref 0.0–149.0)
Total CHOL/HDL Ratio: 4
VLDL: 23.6 mg/dL (ref 0.0–40.0)

## 2015-06-04 LAB — COMPREHENSIVE METABOLIC PANEL
ALT: 21 U/L (ref 0–35)
AST: 16 U/L (ref 0–37)
Albumin: 4.3 g/dL (ref 3.5–5.2)
Alkaline Phosphatase: 60 U/L (ref 39–117)
BILIRUBIN TOTAL: 0.3 mg/dL (ref 0.2–1.2)
BUN: 12 mg/dL (ref 6–23)
CALCIUM: 9.4 mg/dL (ref 8.4–10.5)
CO2: 29 mEq/L (ref 19–32)
Chloride: 101 mEq/L (ref 96–112)
Creatinine, Ser: 0.74 mg/dL (ref 0.40–1.20)
GFR: 96.65 mL/min (ref >60.00)
GLUCOSE: 97 mg/dL (ref 70–99)
POTASSIUM: 4.1 meq/L (ref 3.5–5.1)
Sodium: 138 mEq/L (ref 135–145)
Total Protein: 7.2 g/dL (ref 6.0–8.3)

## 2015-06-04 LAB — CBC WITH DIFFERENTIAL/PLATELET
BASOS PCT: 0.7 % (ref 0.0–3.0)
Basophils Absolute: 0.1 10*3/uL (ref 0.0–0.1)
EOS PCT: 2.8 % (ref 0.0–5.0)
Eosinophils Absolute: 0.3 10*3/uL (ref 0.0–0.7)
HEMATOCRIT: 42.8 % (ref 36.0–46.0)
Hemoglobin: 14.3 g/dL (ref 12.0–15.0)
LYMPHS ABS: 2.8 10*3/uL (ref 0.7–4.0)
Lymphocytes Relative: 27 % (ref 12.0–46.0)
MCHC: 33.5 g/dL (ref 30.0–36.0)
MCV: 87.1 fl (ref 78.0–100.0)
Monocytes Absolute: 0.5 10*3/uL (ref 0.1–1.0)
Monocytes Relative: 4.9 % (ref 3.0–12.0)
NEUTROS PCT: 64.6 % (ref 43.0–77.0)
Neutro Abs: 6.7 10*3/uL (ref 1.4–7.7)
Platelets: 245 10*3/uL (ref 150.0–400.0)
RBC: 4.92 Mil/uL (ref 3.87–5.11)
RDW: 13 % (ref 11.5–15.5)
WBC: 10.4 10*3/uL (ref 4.0–10.5)

## 2015-06-04 LAB — TSH: TSH: 2.11 u[IU]/mL (ref 0.35–4.50)

## 2015-06-04 NOTE — Assessment & Plan Note (Signed)
Reviewed preventive care protocols, scheduled due services, and updated immunizations Discussed nutrition, exercise, diet, and healthy lifestyle.  Labs today.  Pap smear done today.  Orders Placed This Encounter  Procedures  . CBC with Differential/Platelet  . Comprehensive metabolic panel  . Lipid panel  . TSH

## 2015-06-04 NOTE — Patient Instructions (Signed)
Please start taking a Prenatal vitamin daily.  Happy Holidays!  We will call you with your lab results and you can view them online.

## 2015-06-04 NOTE — Progress Notes (Signed)
Pre visit review using our clinic review tool, if applicable. No additional management support is needed unless otherwise documented below in the visit note. 

## 2015-06-04 NOTE — Addendum Note (Signed)
Addended by: CHAVERS, NATASHA C on: 06/04/2015 10:50 AM   Modules accepted: SmartSet  
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
## 2015-06-04 NOTE — Addendum Note (Signed)
Addended by: Sydell AxonLAWS, REGINA C on: 06/04/2015 08:56 AM   Modules accepted: Orders

## 2015-06-04 NOTE — Assessment & Plan Note (Signed)
Advised starting daily PNV.

## 2015-06-04 NOTE — Addendum Note (Signed)
Addended by: Sydell AxonLAWS, REGINA C on: 06/04/2015 08:53 AM   Modules accepted: Orders

## 2015-06-07 LAB — CYTOLOGY - PAP

## 2015-06-11 ENCOUNTER — Encounter: Payer: Self-pay | Admitting: *Deleted

## 2016-01-22 ENCOUNTER — Other Ambulatory Visit: Payer: Self-pay | Admitting: Family Medicine

## 2016-01-22 ENCOUNTER — Encounter: Payer: Self-pay | Admitting: Family Medicine

## 2016-01-22 DIAGNOSIS — R140 Abdominal distension (gaseous): Secondary | ICD-10-CM

## 2016-01-22 MED ORDER — NORGESTIMATE-ETH ESTRADIOL 0.25-35 MG-MCG PO TABS: ORAL_TABLET | ORAL | 11 refills | Status: DC

## 2016-04-21 ENCOUNTER — Encounter: Payer: Self-pay | Admitting: Family Medicine

## 2016-04-21 ENCOUNTER — Ambulatory Visit (INDEPENDENT_AMBULATORY_CARE_PROVIDER_SITE_OTHER): Payer: 59 | Admitting: Family Medicine

## 2016-04-21 DIAGNOSIS — J0100 Acute maxillary sinusitis, unspecified: Secondary | ICD-10-CM | POA: Diagnosis not present

## 2016-04-21 DIAGNOSIS — J019 Acute sinusitis, unspecified: Secondary | ICD-10-CM | POA: Insufficient documentation

## 2016-04-21 MED ORDER — PREDNISONE 20 MG PO TABS: ORAL_TABLET | ORAL | 0 refills | Status: DC

## 2016-04-21 NOTE — Progress Notes (Signed)
Pre visit review using our clinic review tool, if applicable. No additional management support is needed unless otherwise documented below in the visit note. 

## 2016-04-21 NOTE — Assessment & Plan Note (Signed)
Most likely viral in origin. Will treat with prednisone taper and nasal saline. If not improving or fever with treat for bacterial infection.

## 2016-04-21 NOTE — Progress Notes (Signed)
   Subjective:    Patient ID: Natalie Ray, female    DOB: 10/11/1980, 35 y.o.   MRN: [id number]  Sinusitis  This is a new problem. The current episode started 1 to 4 weeks ago (10 days). The problem has been gradually worsening since onset. There has been no fever. The pain is moderate. Associated symptoms include congestion, ear pain, sinus pressure and swollen glands. Pertinent negatives include no chills, coughing, headaches, neck pain, shortness of breath or sore throat. (Pain I right teeth and  In right ear  Started as cold then has worsened) Past treatments include oral decongestants. The treatment provided moderate relief.    No history of fall allergies  Former smoker.  Last time with antibitoics several years ago.   Review of Systems  Constitutional: Negative for chills.  HENT: Positive for congestion, ear pain and sinus pressure. Negative for sore throat.   Respiratory: Negative for cough and shortness of breath.   Musculoskeletal: Negative for neck pain.  Neurological: Negative for headaches.   BP Readings from Last 3 Encounters:  04/21/16 125/77  06/04/15 112/68  11/01/14 100/64       Objective:   Physical Exam  Constitutional: Vital signs are normal. She appears well-developed and well-nourished. She is cooperative.  Non-toxic appearance. She does not appear ill. No distress.  HENT:  Head: Normocephalic.  Right Ear: Hearing, external ear and ear canal normal. Tympanic membrane is not erythematous, not retracted and not bulging. A middle ear effusion is present.  Left Ear: Hearing, external ear and ear canal normal. Tympanic membrane is not erythematous, not retracted and not bulging. A middle ear effusion is present.  Nose: Mucosal edema and rhinorrhea present. Right sinus exhibits maxillary sinus tenderness. Right sinus exhibits no frontal sinus tenderness. Left sinus exhibits maxillary sinus tenderness. Left sinus exhibits no frontal sinus tenderness.    Mouth/Throat: Uvula is midline, oropharynx is clear and moist and mucous membranes are normal.  Eyes: Conjunctivae, EOM and lids are normal. Pupils are equal, round, and reactive to light. Lids are everted and swept, no foreign bodies found.  Neck: Trachea normal and normal range of motion. Neck supple. Carotid bruit is not present. No thyroid mass and no thyromegaly present.  Cardiovascular: Normal rate, regular rhythm, S1 normal, S2 normal, normal heart sounds, intact distal pulses and normal pulses.  Exam reveals no gallop and no friction rub.   No murmur heard. Pulmonary/Chest: Effort normal and breath sounds normal. No tachypnea. No respiratory distress. She has no decreased breath sounds. She has no wheezes. She has no rhonchi. She has no rales.  Neurological: She is alert.  Skin: Skin is warm, dry and intact. No rash noted.  Psychiatric: Her speech is normal and behavior is normal. Judgment normal. Her mood appears not anxious. Cognition and memory are normal. She does not exhibit a depressed mood.          Assessment & Plan:

## 2016-04-21 NOTE — Patient Instructions (Addendum)
Complete a course of prednisone for symptoms management.  Continue nasal saline spray or irrigation 2-3 times daily. Call if fever > 101.4 or not improving in 48 hours for possible course of antibiotics.

## 2016-06-17 DIAGNOSIS — Z719 Counseling, unspecified: Secondary | ICD-10-CM | POA: Diagnosis not present

## 2016-06-24 DIAGNOSIS — Z719 Counseling, unspecified: Secondary | ICD-10-CM | POA: Diagnosis not present

## 2016-07-01 DIAGNOSIS — Z719 Counseling, unspecified: Secondary | ICD-10-CM | POA: Diagnosis not present

## 2016-07-08 DIAGNOSIS — Z719 Counseling, unspecified: Secondary | ICD-10-CM | POA: Diagnosis not present

## 2016-07-15 DIAGNOSIS — Z719 Counseling, unspecified: Secondary | ICD-10-CM | POA: Diagnosis not present

## 2016-07-22 DIAGNOSIS — Z719 Counseling, unspecified: Secondary | ICD-10-CM | POA: Diagnosis not present

## 2016-07-29 DIAGNOSIS — Z719 Counseling, unspecified: Secondary | ICD-10-CM | POA: Diagnosis not present

## 2016-08-05 DIAGNOSIS — Z719 Counseling, unspecified: Secondary | ICD-10-CM | POA: Diagnosis not present

## 2016-08-12 DIAGNOSIS — Z719 Counseling, unspecified: Secondary | ICD-10-CM | POA: Diagnosis not present

## 2016-08-19 DIAGNOSIS — Z719 Counseling, unspecified: Secondary | ICD-10-CM | POA: Diagnosis not present

## 2016-08-26 DIAGNOSIS — Z719 Counseling, unspecified: Secondary | ICD-10-CM | POA: Diagnosis not present

## 2016-09-02 DIAGNOSIS — Z719 Counseling, unspecified: Secondary | ICD-10-CM | POA: Diagnosis not present

## 2016-12-17 ENCOUNTER — Encounter: Payer: Self-pay | Admitting: Family Medicine

## 2016-12-17 ENCOUNTER — Ambulatory Visit (INDEPENDENT_AMBULATORY_CARE_PROVIDER_SITE_OTHER): Payer: 59 | Admitting: Family Medicine

## 2016-12-17 ENCOUNTER — Ambulatory Visit: Payer: Self-pay | Admitting: Family Medicine

## 2016-12-17 ENCOUNTER — Other Ambulatory Visit (HOSPITAL_COMMUNITY)
Admission: RE | Admit: 2016-12-17 | Discharge: 2016-12-17 | Disposition: A | Discharge status: Home / Self Care | Payer: 59 | Source: Ambulatory Visit | Attending: Family Medicine | Admitting: Family Medicine

## 2016-12-17 VITALS — BP 108/80 | HR 80 | Ht 67.0 in | Wt 201.0 lb

## 2016-12-17 DIAGNOSIS — Z124 Encounter for screening for malignant neoplasm of cervix: Secondary | ICD-10-CM | POA: Diagnosis not present

## 2016-12-17 DIAGNOSIS — Z308 Encounter for other contraceptive management: Secondary | ICD-10-CM

## 2016-12-17 DIAGNOSIS — Z01419 Encounter for gynecological examination (general) (routine) without abnormal findings: Secondary | ICD-10-CM | POA: Diagnosis not present

## 2016-12-17 DIAGNOSIS — Z309 Encounter for contraceptive management, unspecified: Secondary | ICD-10-CM | POA: Insufficient documentation

## 2016-12-17 LAB — COMPREHENSIVE METABOLIC PANEL
ALK PHOS: 55 U/L (ref 39–117)
ALT: 15 U/L (ref 0–35)
AST: 13 U/L (ref 0–37)
Albumin: 4.4 g/dL (ref 3.5–5.2)
BUN: 11 mg/dL (ref 6–23)
CHLORIDE: 102 meq/L (ref 96–112)
CO2: 30 meq/L (ref 19–32)
Calcium: 9.5 mg/dL (ref 8.4–10.5)
Creatinine, Ser: 0.83 mg/dL (ref 0.40–1.20)
GFR: 83.85 mL/min (ref >60.00)
Glucose, Bld: 97 mg/dL (ref 70–99)
Potassium: 3.8 mEq/L (ref 3.5–5.1)
SODIUM: 138 meq/L (ref 135–145)
Total Bilirubin: 0.3 mg/dL (ref 0.2–1.2)
Total Protein: 7.3 g/dL (ref 6.0–8.3)

## 2016-12-17 LAB — CBC WITH DIFFERENTIAL/PLATELET
Basophils Absolute: 0.1 10*3/uL (ref 0.0–0.1)
Basophils Relative: 0.8 % (ref 0.0–3.0)
EOS PCT: 2.3 % (ref 0.0–5.0)
Eosinophils Absolute: 0.2 10*3/uL (ref 0.0–0.7)
HCT: 42.3 % (ref 36.0–46.0)
Hemoglobin: 14.2 g/dL (ref 12.0–15.0)
LYMPHS ABS: 3 10*3/uL (ref 0.7–4.0)
Lymphocytes Relative: 34.3 % (ref 12.0–46.0)
MCHC: 33.6 g/dL (ref 30.0–36.0)
MCV: 87.5 fl (ref 78.0–100.0)
MONO ABS: 0.5 10*3/uL (ref 0.1–1.0)
MONOS PCT: 6.3 % (ref 3.0–12.0)
NEUTROS ABS: 4.9 10*3/uL (ref 1.4–7.7)
Neutrophils Relative %: 56.3 % (ref 43.0–77.0)
PLATELETS: 270 10*3/uL (ref 150.0–400.0)
RBC: 4.83 Mil/uL (ref 3.87–5.11)
RDW: 13.6 % (ref 11.5–15.5)
WBC: 8.7 10*3/uL (ref 4.0–10.5)

## 2016-12-17 LAB — LIPID PANEL
CHOL/HDL RATIO: 4
Cholesterol: 223 mg/dL — ABNORMAL HIGH (ref 0–200)
HDL: 51.8 mg/dL (ref >39.00)
LDL CALC: 136 mg/dL — AB (ref 0–99)
NONHDL: 170.9
Triglycerides: 176 mg/dL — ABNORMAL HIGH (ref 0.0–149.0)
VLDL: 35.2 mg/dL (ref 0.0–40.0)

## 2016-12-17 LAB — TSH: TSH: 2.08 u[IU]/mL (ref 0.35–4.50)

## 2016-12-17 MED ORDER — NORGESTIMATE-ETH ESTRADIOL 0.25-35 MG-MCG PO TABS: ORAL_TABLET | ORAL | 11 refills | Status: DC

## 2016-12-17 NOTE — Assessment & Plan Note (Signed)
Reviewed preventive care protocols, scheduled due services, and updated immunizations Discussed nutrition, exercise, diet, and healthy lifestyle.  Pap smear done today. 

## 2016-12-17 NOTE — Addendum Note (Signed)
Addended by: Gregery NaVALENCIA, Wylma Tatem P on: 12/17/2016 09:41 AM   Modules accepted: Orders
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
## 2016-12-17 NOTE — Progress Notes (Signed)
Pre visit review using our clinic review tool, if applicable. No additional management support is needed unless otherwise documented below in the visit note. 

## 2016-12-17 NOTE — Patient Instructions (Signed)
Great to see you. Congratulations on your promotion!  We will call you with your results from today and you can view them online.

## 2016-12-17 NOTE — Assessment & Plan Note (Signed)
eRx refill sent for OCPs. She will contact me if she desires GYN referral for IUD insertion.

## 2016-12-18 LAB — CYTOLOGY - PAP
Diagnosis: NEGATIVE
HPV: NOT DETECTED

## 2017-03-19 ENCOUNTER — Encounter: Payer: Self-pay | Admitting: Family Medicine

## 2017-03-24 ENCOUNTER — Encounter: Payer: Self-pay | Admitting: Family Medicine

## 2017-03-24 ENCOUNTER — Ambulatory Visit (INDEPENDENT_AMBULATORY_CARE_PROVIDER_SITE_OTHER): Payer: 59 | Admitting: Family Medicine

## 2017-03-24 DIAGNOSIS — G4700 Insomnia, unspecified: Secondary | ICD-10-CM | POA: Insufficient documentation

## 2017-03-24 MED ORDER — TRAZODONE HCL 50 MG PO TABS: 25.0000 mg | ORAL_TABLET | Freq: Every evening | ORAL | 3 refills | Status: DC | PRN

## 2017-03-24 NOTE — Patient Instructions (Signed)
Great to see you.  We are starting trazodone- 1/2- 1 tablet nightly as needed for insomnia.

## 2017-03-24 NOTE — Assessment & Plan Note (Signed)
>  25 min spent with patient, at least half of which was spent on counseling insomnia.  The problem of recurrent insomnia is discussed. Avoidance of caffeine sources is strongly encouraged. Sleep hygiene issues are reviewed.  eRx sent for trazodone to use nightly as needed for insomnia. Call or return to clinic prn if these symptoms worsen or fail to improve as anticipated. The patient indicates understanding of these issues and agrees with the plan.
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
## 2017-06-15 NOTE — L&D Delivery Note (Signed)
Delivery Note   Natalie EvensJacklyn Mcconico is a 37 y.o. G1P0 at 755w0d Estimated Date of Delivery: 05/18/18  PRE-OPERATIVE DIAGNOSIS:  1) 785w0d pregnancy.  2) Gestational hypertension   POST-OPERATIVE DIAGNOSIS:  1) 695w0d pregnancy s/p Vaginal, Spontaneous    Delivery Type: Vaginal, Spontaneous    Delivery Anesthesia: Epidural   Labor Complications:   none    ESTIMATED BLOOD LOSS: 417  ml    FINDINGS:   1) female infant, Apgar scores of 8    at 1 minute and 9    at 5 minutes and a birthweight of 132.63  ounces.    2) Nuchal cord: No  SPECIMENS:   PLACENTA:   Appearance: Intact , 3 vessel cord   Removal: Spontaneous      Disposition:    held per protocol then discarded   DISPOSITION:  Infant to left in stable condition in the delivery room, with L&D personnel and mother,  NARRATIVE SUMMARY: Labor course:  Ms. Natalie Ray is a G1P0 at 865w0d who presented for induction of labor.  She progressed well in labor with AROM and cytotec x 1 dose.  She received the appropriate epidural  anesthesia and proceeded to complete dilation. She evidenced  maternal expulsive effort during the second stage. She went on to deliver a viable female infant " [name]" . The placenta delivered without problems and was noted to be complete. A perineal and vaginal examination was performed. Lacerations: 2nd degree  . The laceration was repaired with 3-0 Vicryl Rapide suture. The patient tolerated this well. Vaginal Vault count correct.   Doreene Burkennie Tiyon Sanor, CNM  05/11/2018 7:04 PM

## 2017-09-09 ENCOUNTER — Encounter: Payer: Self-pay | Admitting: Family Medicine

## 2017-09-10 ENCOUNTER — Other Ambulatory Visit: Payer: Self-pay | Admitting: Family Medicine

## 2017-09-10 DIAGNOSIS — Z3201 Encounter for pregnancy test, result positive: Secondary | ICD-10-CM

## 2017-09-27 ENCOUNTER — Other Ambulatory Visit: Payer: Self-pay | Admitting: Certified Nurse Midwife

## 2017-09-27 ENCOUNTER — Other Ambulatory Visit (INDEPENDENT_AMBULATORY_CARE_PROVIDER_SITE_OTHER): Payer: 59

## 2017-09-27 ENCOUNTER — Encounter: Payer: Self-pay | Admitting: Certified Nurse Midwife

## 2017-09-27 ENCOUNTER — Ambulatory Visit (INDEPENDENT_AMBULATORY_CARE_PROVIDER_SITE_OTHER): Payer: 59 | Admitting: Certified Nurse Midwife

## 2017-09-27 VITALS — BP 111/74 | HR 85 | Ht 67.0 in | Wt 217.4 lb

## 2017-09-27 DIAGNOSIS — N912 Amenorrhea, unspecified: Secondary | ICD-10-CM

## 2017-09-27 DIAGNOSIS — N926 Irregular menstruation, unspecified: Secondary | ICD-10-CM

## 2017-09-27 LAB — POCT URINE PREGNANCY: PREG TEST UR: POSITIVE — AB

## 2017-09-27 NOTE — Progress Notes (Signed)
New pt is here for confirmation of pregnancy.

## 2017-09-27 NOTE — Patient Instructions (Signed)
Common Medications Safe in Pregnancy  Acne:      Constipation:  Benzoyl Peroxide     Colace  Clindamycin      Dulcolax Suppository  Topica Erythromycin     Fibercon  Salicylic Acid      Metamucil         Miralax AVOID:        Senakot   Accutane    Cough:  Retin-A       Cough Drops  Tetracycline      Phenergan w/ Codeine if Rx  Minocycline      Robitussin (Plain & DM)  Antibiotics:     Crabs/Lice:  Ceclor       RID  Cephalosporins    AVOID:  E-Mycins      Kwell  Keflex  Macrobid/Macrodantin   Diarrhea:  Penicillin      Kao-Pectate  Zithromax      Imodium AD         PUSH FLUIDS AVOID:       Cipro     Fever:  Tetracycline      Tylenol (Regular or Extra  Minocycline       Strength)  Levaquin      Extra Strength-Do not          Exceed 8 tabs/24 hrs Caffeine:        <200mg/day (equiv. To 1 cup of coffee or  approx. 3 12 oz sodas)         Gas: Cold/Hayfever:       Gas-X  Benadryl      Mylicon  Claritin       Phazyme  **Claritin-D        Chlor-Trimeton    Headaches:  Dimetapp      ASA-Free Excedrin  Drixoral-Non-Drowsy     Cold Compress  Mucinex (Guaifenasin)     Tylenol (Regular or Extra  Sudafed/Sudafed-12 Hour     Strength)  **Sudafed PE Pseudoephedrine   Tylenol Cold & Sinus     Vicks Vapor Rub  Zyrtec  **AVOID if Problems With Blood Pressure         Heartburn: Avoid lying down for at least 1 hour after meals  Aciphex      Maalox     Rash:  Milk of Magnesia     Benadryl    Mylanta       1% Hydrocortisone Cream  Pepcid  Pepcid Complete   Sleep Aids:  Prevacid      Ambien   Prilosec       Benadryl  Rolaids       Chamomile Tea  Tums (Limit 4/day)     Unisom  Zantac       Tylenol PM         Warm milk-add vanilla or  Hemorrhoids:       Sugar for taste  Anusol/Anusol H.C.  (RX: Analapram 2.5%)  Sugar Substitutes:  Hydrocortisone OTC     Ok in moderation  Preparation H      Tucks        Vaseline lotion applied to tissue with  wiping    Herpes:     Throat:  Acyclovir      Oragel  Famvir  Valtrex     Vaccines:         Flu Shot Leg Cramps:       *Gardasil  Benadryl      Hepatitis A         Hepatitis B Nasal Spray:         Pneumovax  Saline Nasal Spray     Polio Booster         Tetanus Nausea:       Tuberculosis test or PPD  Vitamin B6 25 mg TID   AVOID:    Dramamine      *Gardasil  Emetrol       Live Poliovirus  Ginger Root 250 mg QID    MMR (measles, mumps &  High Complex Carbs @ Bedtime    rebella)  Sea Bands-Accupressure    Varicella (Chickenpox)  Unisom 1/2 tab TID     *No known complications           If received before Pain:         Known pregnancy;   Darvocet       Resume series after  Lortab        Delivery  Percocet    Yeast:   Tramadol      Femstat  Tylenol 3      Gyne-lotrimin  Ultram       Monistat  Vicodin           MISC:         All Sunscreens           Hair Coloring/highlights          Insect Repellant's          (Including DEET)         Mystic Tans Prenatal Care WHAT IS PRENATAL CARE? Prenatal care is the process of caring for a pregnant woman before she gives birth. Prenatal care makes sure that she and her baby remain as healthy as possible throughout pregnancy. Prenatal care may be provided by a midwife, family practice health care provider, or a childbirth and pregnancy specialist (obstetrician). Prenatal care may include physical examinations, testing, treatments, and education on nutrition, lifestyle, and social support services. WHY IS PRENATAL CARE SO IMPORTANT? Early and consistent prenatal care increases the chance that you and your baby will remain healthy throughout your pregnancy. This type of care also decreases a baby's risk of being born too early (prematurely), or being born smaller than expected (small for gestational age). Any underlying medical conditions you may have that could pose a risk during your pregnancy are discussed during prenatal care visits. You will also  be monitored regularly for any new conditions that may arise during your pregnancy so they can be treated quickly and effectively. WHAT HAPPENS DURING PRENATAL CARE VISITS? Prenatal care visits may include the following: Discussion Tell your health care provider about any new signs or symptoms you have experienced since your last visit. These might include:  Nausea or vomiting.  Increased or decreased level of energy.  Difficulty sleeping.  Back or leg pain.  Weight changes.  Frequent urination.  Shortness of breath with physical activity.  Changes in your skin, such as the development of a rash or itchiness.  Vaginal discharge or bleeding.  Feelings of excitement or nervousness.  Changes in your baby's movements.  You may want to write down any questions or topics you want to discuss with your health care provider and bring them with you to your appointment. Examination During your first prenatal care visit, you will likely have a complete physical exam. Your health care provider will often examine your vagina, cervix, and the position of your uterus, as well as check your heart, lungs, and other body systems. As your pregnancy progresses, your health care provider will measure the size of  your uterus and your baby's position inside your uterus. He or she may also examine you for early signs of labor. Your prenatal visits may also include checking your blood pressure and, after about 10-12 weeks of pregnancy, listening to your baby's heartbeat. Testing Regular testing often includes:  Urinalysis. This checks your urine for glucose, protein, or signs of infection.  Blood count. This checks the levels of white and red blood cells in your body.  Tests for sexually transmitted infections (STIs). Testing for STIs at the beginning of pregnancy is routinely done and is required in many states.  Antibody testing. You will be checked to see if you are immune to certain illnesses, such  as rubella, that can affect a developing fetus.  Glucose screen. Around 24-28 weeks of pregnancy, your blood glucose level will be checked for signs of gestational diabetes. Follow-up tests may be recommended.  Group B strep. This is a bacteria that is commonly found inside a woman's vagina. This test will inform your health care provider if you need an antibiotic to reduce the amount of this bacteria in your body prior to labor and childbirth.  Ultrasound. Many pregnant women undergo an ultrasound screening around 18-20 weeks of pregnancy to evaluate the health of the fetus and check for any developmental abnormalities.  HIV (human immunodeficiency virus) testing. Early in your pregnancy, you will be screened for HIV. If you are at high risk for HIV, this test may be repeated during your third trimester of pregnancy.  You may be offered other testing based on your age, personal or family medical history, or other factors. HOW OFTEN SHOULD I PLAN TO SEE MY HEALTH CARE PROVIDER FOR PRENATAL CARE? Your prenatal care check-up schedule depends on any medical conditions you have before, or develop during, your pregnancy. If you do not have any underlying medical conditions, you will likely be seen for checkups:  Monthly, during the first 6 months of pregnancy.  Twice a month during months 7 and 8 of pregnancy.  Weekly starting in the 9th month of pregnancy and until delivery.  If you develop signs of early labor or other concerning signs or symptoms, you may need to see your health care provider more often. Ask your health care provider what prenatal care schedule is best for you. WHAT CAN I DO TO KEEP MYSELF AND MY BABY AS HEALTHY AS POSSIBLE DURING MY PREGNANCY?  Take a prenatal vitamin containing 400 micrograms (0.4 mg) of folic acid every day. Your health care provider may also ask you to take additional vitamins such as iodine, vitamin D, iron, copper, and zinc.  Take 1500-2000 mg of calcium  daily starting at your 20th week of pregnancy until you deliver your baby.  Make sure you are up to date on your vaccinations. Unless directed otherwise by your health care provider: ? You should receive a tetanus, diphtheria, and pertussis (Tdap) vaccination between the 27th and 36th week of your pregnancy, regardless of when your last Tdap immunization occurred. This helps protect your baby from whooping cough (pertussis) after he or she is born. ? You should receive an annual inactivated influenza vaccine (IIV) to help protect you and your baby from influenza. This can be done at any point during your pregnancy.  Eat a well-rounded diet that includes: ? Fresh fruits and vegetables. ? Lean proteins. ? Calcium-rich foods such as milk, yogurt, hard cheeses, and dark, leafy greens. ? Whole grain breads.  Do noteat seafood high in mercury, including: ?   Swordfish. ? Tilefish. ? Shark. ? King mackerel. ? More than 6 oz tuna per week.  Do not eat: ? Raw or undercooked meats or eggs. ? Unpasteurized foods, such as soft cheeses (brie, blue, or feta), juices, and milks. ? Lunch meats. ? Hot dogs that have not been heated until they are steaming.  Drink enough water to keep your urine clear or pale yellow. For many women, this may be 10 or more 8 oz glasses of water each day. Keeping yourself hydrated helps deliver nutrients to your baby and may prevent the start of pre-term uterine contractions.  Do not use any tobacco products including cigarettes, chewing tobacco, or electronic cigarettes. If you need help quitting, ask your health care provider.  Do not drink beverages containing alcohol. No safe level of alcohol consumption during pregnancy has been determined.  Do not use any illegal drugs. These can harm your developing baby or cause a miscarriage.  Ask your health care provider or pharmacist before taking any prescription or over-the-counter medicines, herbs, or supplements.  Limit  your caffeine intake to no more than 200 mg per day.  Exercise. Unless told otherwise by your health care provider, try to get 30 minutes of moderate exercise most days of the week. Do not  do high-impact activities, contact sports, or activities with a high risk of falling, such as horseback riding or downhill skiing.  Get plenty of rest.  Avoid anything that raises your body temperature, such as hot tubs and saunas.  If you own a cat, do not empty its litter box. Bacteria contained in cat feces can cause an infection called toxoplasmosis. This can result in serious harm to the fetus.  Stay away from chemicals such as insecticides, lead, mercury, and cleaning or paint products that contain solvents.  Do not have any X-rays taken unless medically necessary.  Take a childbirth and breastfeeding preparation class. Ask your health care provider if you need a referral or recommendation.  This information is not intended to replace advice given to you by your health care provider. Make sure you discuss any questions you have with your health care provider. Document Released: 06/04/2003 Document Revised: 11/04/2015 Document Reviewed: 08/16/2013 Elsevier Interactive Patient Education  2017 [employer].

## 2017-09-27 NOTE — Progress Notes (Signed)
Subjective:    Natalie Ray is a 37 y.o. female who presents for evaluation of amenorrhea. She believes she could be pregnant. Pregnancy is desired. Sexual Activity: single partner, contraception: none. Current symptoms also include: nausea. Last period was normal but is unsure of LMP.   No LMP recorded (lmp unknown). The following portions of the patient's history were reviewed and updated as appropriate: allergies, current medications, past family history, past medical history, past social history, past surgical history and problem list.  Review of Systems Pertinent items are noted in HPI.     Objective:    BP 111/74   Pulse 85   Ht 5\' 7"  (1.702 m)   Wt 217 lb 7 oz (98.6 kg)   LMP  (LMP Unknown)   BMI 34.06 kg/m  General: alert, cooperative, appears stated age and no acute distress    Lab Review Urine HCG: positive    ULTRASOUND REPORT  Location: [facility] Date of Service:  09/27/2017  Indications: Dating/Viability Findings:  Natalie Ray intrauterine pregnancy is visualized with a CRL consistent with 6 5/[redacted] weeks gestation, giving an (U/S) EDD of 05/18/18. A clinical EDD has not been established due to unknown LMP.  FHR: 147 BPM CRL measurement: 8.0 mm Yolk sac and early anatomy is normal.  Right Ovary was not visualized due to overlying bowel gas. Left Ovary measures 2.8 x 1.7 x 2.0 cm. It is normal appearance. There is no obvious evidence of a corpus luteal cyst. Survey of the adnexa demonstrates no adnexal masses. There is no free peritoneal fluid in the cul de sac.  Impression: 1. 6 5/7 week Viable Singleton Intrauterine pregnancy by U/S. 2. A clinical EDD has not been established due to unknown LMP.  Recommendations: 1.Clinical correlation with the patient's History and Physical Exam. 2. Today's ultrasound should be used to establish EDD of 05/18/18.  Kari BaarsJill Long, RDMS   Assessment:    Absence of menstruation.     Plan:    [facility]. Briefly discussed pre-natal care options. Midwifery vs MD care. She request to be midwife pt. Encouraged well-balanced diet, plenty of rest when needed, pre-natal vitamins daily and walking for exercise. Instructed to stop the trazodone and encouraged use of benadryl or Unisom. Discussed self-help for nausea, avoiding OTC medications until consulting provider or pharmacist, other than Tylenol as needed, minimal caffeine (1-2 cups daily) and avoiding alcohol. She will schedule her initial nurse visit @ 10 wks and her NOB physical exam @ 12 wk.   Doreene BurkeAnnie Joanie Ray, CNM

## 2017-10-01 ENCOUNTER — Encounter: Payer: Self-pay | Admitting: Certified Nurse Midwife

## 2017-10-22 ENCOUNTER — Ambulatory Visit (INDEPENDENT_AMBULATORY_CARE_PROVIDER_SITE_OTHER): Payer: 59 | Admitting: Certified Nurse Midwife

## 2017-10-22 VITALS — BP 102/75 | HR 81 | Ht 67.0 in | Wt 217.6 lb

## 2017-10-22 DIAGNOSIS — Z3401 Encounter for supervision of normal first pregnancy, first trimester: Secondary | ICD-10-CM | POA: Diagnosis not present

## 2017-10-22 NOTE — Progress Notes (Signed)
[name] presents for NOB nurse interview visit. Pregnancy confirmation done _4/15/19 [facility]_____.  G-1.  P-   0 . Pregnancy education material explained and given. __1_ cats in the home. NOB labs ordered. (TSH/HbgA1c due to Increased BMI),  HIV labs and Drug screen were explained optional and she did not decline. Drug screen ordered. PNV encouraged. Genetic screening options discussed. Genetic testing: Ordered.  Pt may discuss with provider. Pt. To follow up with provider in _2_ weeks for NOB physical.  All questions answered.

## 2017-10-23 LAB — CBC WITH DIFFERENTIAL
Basophils Absolute: 0 10*3/uL (ref 0.0–0.2)
Basos: 0 %
EOS (ABSOLUTE): 0.1 10*3/uL (ref 0.0–0.4)
EOS: 1 %
HEMOGLOBIN: 13.5 g/dL (ref 11.1–15.9)
Hematocrit: 40.1 % (ref 34.0–46.6)
IMMATURE GRANS (ABS): 0.1 10*3/uL (ref 0.0–0.1)
IMMATURE GRANULOCYTES: 1 %
LYMPHS: 22 %
Lymphocytes Absolute: 2.4 10*3/uL (ref 0.7–3.1)
MCH: 29.5 pg (ref 26.6–33.0)
MCHC: 33.7 g/dL (ref 31.5–35.7)
MCV: 88 fL (ref 79–97)
MONOCYTES: 5 %
Monocytes Absolute: 0.5 10*3/uL (ref 0.1–0.9)
Neutrophils Absolute: 8.2 10*3/uL — ABNORMAL HIGH (ref 1.4–7.0)
Neutrophils: 71 %
RBC: 4.57 x10E6/uL (ref 3.77–5.28)
RDW: 13.7 % (ref 12.3–15.4)
WBC: 11.4 10*3/uL — ABNORMAL HIGH (ref 3.4–10.8)

## 2017-10-23 LAB — URINALYSIS, ROUTINE W REFLEX MICROSCOPIC
Bilirubin, UA: NEGATIVE
GLUCOSE, UA: NEGATIVE
KETONES UA: NEGATIVE
Leukocytes, UA: NEGATIVE
NITRITE UA: NEGATIVE
RBC, UA: NEGATIVE
SPEC GRAV UA: 1.024 (ref 1.005–1.030)
UUROB: 0.2 mg/dL (ref 0.2–1.0)
pH, UA: 8 — ABNORMAL HIGH (ref 5.0–7.5)

## 2017-10-23 LAB — TOXOPLASMA ANTIBODIES- IGG AND  IGM
Toxoplasma Antibody- IgM: <3 AU/mL (ref 0.0–7.9)
Toxoplasma IgG Ratio: <3 IU/mL (ref 0.0–7.1)

## 2017-10-23 LAB — HEMOGLOBIN A1C
Est. average glucose Bld gHb Est-mCnc: 111 mg/dL
Hgb A1c MFr Bld: 5.5 % (ref 4.8–5.6)

## 2017-10-23 LAB — HEPATITIS B SURFACE ANTIGEN: HEP B S AG: NEGATIVE

## 2017-10-23 LAB — RUBELLA SCREEN: Rubella Antibodies, IGG: 1.16 index (ref >0.99)

## 2017-10-23 LAB — ANTIBODY SCREEN: ANTIBODY SCREEN: NEGATIVE

## 2017-10-23 LAB — RPR: RPR Ser Ql: NONREACTIVE

## 2017-10-23 LAB — TSH: TSH: 1.28 u[IU]/mL (ref 0.450–4.500)

## 2017-10-23 LAB — HIV ANTIBODY (ROUTINE TESTING W REFLEX): HIV SCREEN 4TH GENERATION: NONREACTIVE

## 2017-10-23 LAB — GC/CHLAMYDIA PROBE AMP
CHLAMYDIA, DNA PROBE: NEGATIVE
NEISSERIA GONORRHOEAE BY PCR: NEGATIVE

## 2017-10-23 LAB — VARICELLA ZOSTER ANTIBODY, IGG: Varicella zoster IgG: 1434 index (ref >165)

## 2017-10-23 LAB — ABO AND RH: Rh Factor: POSITIVE

## 2017-10-24 LAB — URINE CULTURE: ORGANISM ID, BACTERIA: NO GROWTH

## 2017-10-25 LAB — MONITOR DRUG PROFILE 14(MW)
AMPHETAMINE SCREEN URINE: NEGATIVE ng/mL
BARBITURATE SCREEN URINE: NEGATIVE ng/mL
BENZODIAZEPINE SCREEN, URINE: NEGATIVE ng/mL
Buprenorphine, Urine: NEGATIVE ng/mL
CANNABINOIDS UR QL SCN: NEGATIVE ng/mL
Cocaine (Metab) Scrn, Ur: NEGATIVE ng/mL
Creatinine(Crt), U: 238.3 mg/dL (ref 20.0–300.0)
Fentanyl, Urine: NEGATIVE pg/mL
Meperidine Screen, Urine: NEGATIVE ng/mL
Methadone Screen, Urine: NEGATIVE ng/mL
OXYCODONE+OXYMORPHONE UR QL SCN: NEGATIVE ng/mL
Opiate Scrn, Ur: NEGATIVE ng/mL
PH UR, DRUG SCRN: 7.9 (ref 4.5–8.9)
Phencyclidine Qn, Ur: NEGATIVE ng/mL
Propoxyphene Scrn, Ur: NEGATIVE ng/mL
SPECIFIC GRAVITY: 1.017
Tramadol Screen, Urine: NEGATIVE ng/mL

## 2017-10-25 LAB — NICOTINE SCREEN, URINE: COTININE UR QL SCN: NEGATIVE ng/mL

## 2017-11-01 ENCOUNTER — Encounter: Payer: Self-pay | Admitting: Certified Nurse Midwife

## 2017-11-01 ENCOUNTER — Ambulatory Visit (INDEPENDENT_AMBULATORY_CARE_PROVIDER_SITE_OTHER): Payer: 59 | Admitting: Certified Nurse Midwife

## 2017-11-01 VITALS — BP 111/80 | HR 84 | Wt 218.1 lb

## 2017-11-01 DIAGNOSIS — Z3401 Encounter for supervision of normal first pregnancy, first trimester: Secondary | ICD-10-CM

## 2017-11-01 DIAGNOSIS — O99211 Obesity complicating pregnancy, first trimester: Secondary | ICD-10-CM

## 2017-11-01 LAB — POCT URINALYSIS DIPSTICK
Bilirubin, UA: NEGATIVE
Blood, UA: NEGATIVE
GLUCOSE UA: NEGATIVE
Glucose, UA: NEGATIVE
Ketones, UA: NEGATIVE
LEUKOCYTES UA: NEGATIVE
NITRITE UA: NEGATIVE
PH UA: 6 (ref 5.0–8.0)
PROTEIN UA: NEGATIVE
Protein, UA: NEGATIVE
Spec Grav, UA: 1.025 (ref 1.010–1.025)
Urobilinogen, UA: 0.2 E.U./dL

## 2017-11-01 NOTE — Progress Notes (Signed)
Body mass index is 34.16 kg/m.

## 2017-11-01 NOTE — Progress Notes (Signed)
Pt is here for a NOB visit.

## 2017-11-01 NOTE — Patient Instructions (Signed)
Common Medications Safe in Pregnancy  Acne:      Constipation:  Benzoyl Peroxide     Colace  Clindamycin      Dulcolax Suppository  Topica Erythromycin     Fibercon  Salicylic Acid      Metamucil         Miralax AVOID:        Senakot   Accutane    Cough:  Retin-A       Cough Drops  Tetracycline      Phenergan w/ Codeine if Rx  Minocycline      Robitussin (Plain & DM)  Antibiotics:     Crabs/Lice:  Ceclor       RID  Cephalosporins    AVOID:  E-Mycins      Kwell  Keflex  Macrobid/Macrodantin   Diarrhea:  Penicillin      Kao-Pectate  Zithromax      Imodium AD         PUSH FLUIDS AVOID:       Cipro     Fever:  Tetracycline      Tylenol (Regular or Extra  Minocycline       Strength)  Levaquin      Extra Strength-Do not          Exceed 8 tabs/24 hrs Caffeine:        <200mg/day (equiv. To 1 cup of coffee or  approx. 3 12 oz sodas)         Gas: Cold/Hayfever:       Gas-X  Benadryl      Mylicon  Claritin       Phazyme  **Claritin-D        Chlor-Trimeton    Headaches:  Dimetapp      ASA-Free Excedrin  Drixoral-Non-Drowsy     Cold Compress  Mucinex (Guaifenasin)     Tylenol (Regular or Extra  Sudafed/Sudafed-12 Hour     Strength)  **Sudafed PE Pseudoephedrine   Tylenol Cold & Sinus     Vicks Vapor Rub  Zyrtec  **AVOID if Problems With Blood Pressure         Heartburn: Avoid lying down for at least 1 hour after meals  Aciphex      Maalox     Rash:  Milk of Magnesia     Benadryl    Mylanta       1% Hydrocortisone Cream  Pepcid  Pepcid Complete   Sleep Aids:  Prevacid      Ambien   Prilosec       Benadryl  Rolaids       Chamomile Tea  Tums (Limit 4/day)     Unisom  Zantac       Tylenol PM         Warm milk-add vanilla or  Hemorrhoids:       Sugar for taste  Anusol/Anusol H.C.  (RX: Analapram 2.5%)  Sugar Substitutes:  Hydrocortisone OTC     Ok in moderation  Preparation H      Tucks        Vaseline lotion applied to tissue with  wiping    Herpes:     Throat:  Acyclovir      Oragel  Famvir  Valtrex     Vaccines:         Flu Shot Leg Cramps:       *Gardasil  Benadryl      Hepatitis A         Hepatitis B Nasal Spray:         Pneumovax  Saline Nasal Spray     Polio Booster         Tetanus Nausea:       Tuberculosis test or PPD  Vitamin B6 25 mg TID   AVOID:    Dramamine      *Gardasil  Emetrol       Live Poliovirus  Ginger Root 250 mg QID    MMR (measles, mumps &  High Complex Carbs @ Bedtime    rebella)  Sea Bands-Accupressure    Varicella (Chickenpox)  Unisom 1/2 tab TID     *No known complications           If received before Pain:         Known pregnancy;   Darvocet       Resume series after  Lortab        Delivery  Percocet    Yeast:   Tramadol      Femstat  Tylenol 3      Gyne-lotrimin  Ultram       Monistat  Vicodin           MISC:         All Sunscreens           Hair Coloring/highlights          Insect Repellant's          (Including DEET)         Mystic Tans Eating Plan for Pregnant Women While you are pregnant, your body will require additional nutrition to help support your growing baby. It is recommended that you consume:  150 additional calories each day during your first trimester.  300 additional calories each day during your second trimester.  300 additional calories each day during your third trimester.  Eating a healthy, well-balanced diet is very important for your health and for your baby's health. You also have a higher need for some vitamins and minerals, such as folic acid, calcium, iron, and vitamin D. What do I need to know about eating during pregnancy?  Do not try to lose weight or go on a diet during pregnancy.  Choose healthy, nutritious foods. Choose  of a sandwich with a glass of milk instead of a candy bar or a high-calorie sugar-sweetened beverage.  Limit your overall intake of foods that have "empty calories." These are foods that have little nutritional  value, such as sweets, desserts, candies, sugar-sweetened beverages, and fried foods.  Eat a variety of foods, especially fruits and vegetables.  Take a prenatal vitamin to help meet the additional needs during pregnancy, specifically for folic acid, iron, calcium, and vitamin D.  Remember to stay active. Ask your health care provider for exercise recommendations that are specific to you.  Practice good food safety and cleanliness, such as washing your hands before you eat and after you prepare raw meat. This helps to prevent foodborne illnesses, such as listeriosis, that can be very dangerous for your baby. Ask your health care provider for more information about listeriosis. What does 150 extra calories look like? Healthy options for an additional 150 calories each day could be any of the following:  Plain low-fat yogurt (6-8 oz) with  cup of berries.  1 apple with 2 teaspoons of peanut butter.  Cut-up vegetables with  cup of hummus.  Low-fat chocolate milk (8 oz or 1 cup).  1 string cheese with 1 medium orange.   of a peanut butter and jelly sandwich on whole-wheat bread (1  tsp of peanut butter).  For 300 calories, you could eat two of those healthy options each day. What is a healthy amount of weight to gain? The recommended amount of weight for you to gain is based on your pre-pregnancy BMI. If your pre-pregnancy BMI was:  Less than 18 (underweight), you should gain 28-40 lb.  18-24.9 (normal), you should gain 25-35 lb.  25-29.9 (overweight), you should gain 15-25 lb.  Greater than 30 (obese), you should gain 11-20 lb.  What if I am having twins or multiples? Generally, pregnant women who will be having twins or multiples may need to increase their daily calories by 300-600 calories each day. The recommended range for total weight gain is 25-54 lb, depending on your pre-pregnancy BMI. Talk with your health care provider for specific guidance about additional nutritional  needs, weight gain, and exercise during your pregnancy. What foods can I eat? Grains Any grains. Try to choose whole grains, such as whole-wheat bread, oatmeal, or brown rice. Vegetables Any vegetables. Try to eat a variety of colors and types of vegetables to get a full range of vitamins and minerals. Remember to wash your vegetables well before eating. Fruits Any fruits. Try to eat a variety of colors and types of fruit to get a full range of vitamins and minerals. Remember to wash your fruits well before eating. Meats and Other Protein Sources Lean meats, including chicken, [place], fish, and lean cuts of beef, veal, or pork. Make sure that all meats are cooked to "well done." Tofu. Tempeh. Beans. Eggs. Peanut butter and other nut butters. Seafood, such as shrimp, crab, and lobster. If you choose fish, select types that are higher in omega-3 fatty acids, including salmon, herring, mussels, trout, sardines, and pollock. Make sure that all meats are cooked to food-safe temperatures. Dairy Pasteurized milk and milk alternatives. Pasteurized yogurt and pasteurized cheese. Cottage cheese. Sour cream. Beverages Water. Juices that contain 100% fruit juice or vegetable juice. Caffeine-free teas and decaffeinated coffee. Drinks that contain caffeine are okay to drink, but it is better to avoid caffeine. Keep your total caffeine intake to less than 200 mg each day (12 oz of coffee, tea, or soda) or as directed by your health care provider. Condiments Any pasteurized condiments. Sweets and Desserts Any sweets and desserts. Fats and Oils Any fats and oils. The items listed above may not be a complete list of recommended foods or beverages. Contact your dietitian for more options. What foods are not recommended? Vegetables Unpasteurized (raw) vegetable juices. Fruits Unpasteurized (raw) fruit juices. Meats and Other Protein Sources Cured meats that have nitrates, such as bacon, salami, and hotdogs.  Luncheon meats, bologna, or other deli meats (unless they are reheated until they are steaming hot). Refrigerated pate, meat spreads from a meat counter, smoked seafood that is found in the refrigerated section of a store. Raw fish, such as sushi or sashimi. High mercury content fish, such as tilefish, shark, swordfish, and king mackerel. Raw meats, such as tuna or beef tartare. Undercooked meats and poultry. Make sure that all meats are cooked to food-safe temperatures. Dairy Unpasteurized (raw) milk and any foods that have raw milk in them. Soft cheeses, such as feta, queso blanco, queso fresco, Brie, Camembert cheeses, blue-veined cheeses, and Panela cheese (unless it is made with pasteurized milk, which must be stated on the label). Beverages Alcohol. Sugar-sweetened beverages, such as sodas, teas, or energy drinks. Condiments Homemade fermented foods and drinks, such as pickles, sauerkraut, or kombucha drinks. (Store-bought  pasteurized versions of these are okay.) Other Salads that are made in the store, such as ham salad, chicken salad, egg salad, tuna salad, and seafood salad. The items listed above may not be a complete list of foods and beverages to avoid. Contact your dietitian for more information. This information is not intended to replace advice given to you by your health care provider. Make sure you discuss any questions you have with your health care provider. Document Released: 03/16/2014 Document Revised: 11/07/2015 Document Reviewed: 11/14/2013 Elsevier Interactive Patient Education  [employer].
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
## 2017-11-03 ENCOUNTER — Encounter: Payer: Self-pay | Admitting: Certified Nurse Midwife

## 2017-11-15 ENCOUNTER — Encounter: Payer: Self-pay | Admitting: Certified Nurse Midwife

## 2017-11-16 ENCOUNTER — Encounter: Payer: Self-pay | Admitting: Certified Nurse Midwife

## 2017-12-01 ENCOUNTER — Ambulatory Visit (INDEPENDENT_AMBULATORY_CARE_PROVIDER_SITE_OTHER): Payer: 59 | Admitting: Certified Nurse Midwife

## 2017-12-01 ENCOUNTER — Other Ambulatory Visit: Payer: 59

## 2017-12-01 ENCOUNTER — Other Ambulatory Visit: Payer: Self-pay

## 2017-12-01 VITALS — BP 102/77 | HR 93 | Wt 216.5 lb

## 2017-12-01 DIAGNOSIS — O99211 Obesity complicating pregnancy, first trimester: Secondary | ICD-10-CM

## 2017-12-01 DIAGNOSIS — Z3401 Encounter for supervision of normal first pregnancy, first trimester: Secondary | ICD-10-CM

## 2017-12-01 LAB — POCT URINALYSIS DIPSTICK
BILIRUBIN UA: NEGATIVE
Blood, UA: NEGATIVE
Glucose, UA: NEGATIVE
Leukocytes, UA: NEGATIVE
Nitrite, UA: NEGATIVE
PROTEIN UA: NEGATIVE
Spec Grav, UA: 1.025 (ref 1.010–1.025)
Urobilinogen, UA: 0.2 E.U./dL
pH, UA: 6 (ref 5.0–8.0)

## 2017-12-01 NOTE — Progress Notes (Signed)
ROB, doing well. Having early Glucose screening today. Does not feel fetal movement yet- reassurance given. Reviewed round ligament pain. Discussed anatomy scan at next visit. Follow up 4 wks.   Doreene BurkeAnnie Coron Rossano, CNM

## 2017-12-01 NOTE — Patient Instructions (Signed)

## 2017-12-01 NOTE — Progress Notes (Signed)
Pt is here for an ROB visit and is doing early GTT.

## 2017-12-02 LAB — GLUCOSE TOLERANCE, 1 HOUR: Glucose, 1Hr PP: 109 mg/dL (ref 65–199)

## 2017-12-03 ENCOUNTER — Encounter (INDEPENDENT_AMBULATORY_CARE_PROVIDER_SITE_OTHER): Payer: Self-pay

## 2017-12-30 ENCOUNTER — Ambulatory Visit (INDEPENDENT_AMBULATORY_CARE_PROVIDER_SITE_OTHER): Payer: 59

## 2017-12-30 ENCOUNTER — Ambulatory Visit (INDEPENDENT_AMBULATORY_CARE_PROVIDER_SITE_OTHER): Payer: 59 | Admitting: Certified Nurse Midwife

## 2017-12-30 VITALS — BP 115/74 | HR 88 | Wt 219.1 lb

## 2017-12-30 DIAGNOSIS — Z3492 Encounter for supervision of normal pregnancy, unspecified, second trimester: Secondary | ICD-10-CM

## 2017-12-30 DIAGNOSIS — Z363 Encounter for antenatal screening for malformations: Secondary | ICD-10-CM | POA: Diagnosis not present

## 2017-12-30 DIAGNOSIS — Z3401 Encounter for supervision of normal first pregnancy, first trimester: Secondary | ICD-10-CM

## 2017-12-30 LAB — POCT URINALYSIS DIPSTICK
Bilirubin, UA: NEGATIVE
Blood, UA: NEGATIVE
Glucose, UA: NEGATIVE
Leukocytes, UA: NEGATIVE
Nitrite, UA: NEGATIVE
PH UA: 5 (ref 5.0–8.0)
Protein, UA: NEGATIVE
Spec Grav, UA: 1.02 (ref 1.010–1.025)
UROBILINOGEN UA: 0.2 U/dL

## 2017-12-30 NOTE — Progress Notes (Signed)
Pt is here for an ROB and anatomy scan. She became very hot and claustrophobic.

## 2017-12-30 NOTE — Patient Instructions (Signed)
WHAT OB PATIENTS CAN EXPECT   Confirmation of pregnancy and ultrasound ordered if medically indicated-[redacted] weeks gestation  New OB (NOB) intake with nurse and New OB (NOB) labs- [redacted] weeks gestation  New OB (NOB) physical examination with provider- 11/[redacted] weeks gestation  Flu vaccine-[redacted] weeks gestation  Anatomy scan-[redacted] weeks gestation  Glucose tolerance test, blood work to test for anemia, T-dap vaccine-[redacted] weeks gestation  Vaginal swabs/cultures-STD/Group B strep-[redacted] weeks gestation  Appointments every 4 weeks until 28 weeks  Every 2 weeks from 28 weeks until 36 weeks  Weekly visits from 36 weeks until delivery  Back Pain in Pregnancy Back pain during pregnancy is common. Back pain may be caused by several factors that are related to changes during your pregnancy. Follow these instructions at home: Managing pain, stiffness, and swelling  If directed, apply ice for sudden (acute) back pain. ? Put ice in a plastic bag. ? Place a towel between your skin and the bag. ? Leave the ice on for 20 minutes, 2-3 times per day.  If directed, apply heat to the affected area before you exercise: ? Place a towel between your skin and the heat pack or heating pad. ? Leave the heat on for 20-30 minutes. ? Remove the heat if your skin turns bright red. This is especially important if you are unable to feel pain, heat, or cold. You may have a greater risk of getting burned. Activity  Exercise as told by your health care provider. Exercising is the best way to prevent or manage back pain.  Listen to your body when lifting. If lifting hurts, ask for help or bend your knees. This uses your leg muscles instead of your back muscles.  Squat down when picking up something from the floor. Do not bend over.  Only use bed rest as told by your health care provider. Bed rest should only be used for the most severe episodes of back pain. Standing, Sitting, and Lying Down  Do not stand in one place for long  periods of time.  Use good posture when sitting. Make sure your head rests over your shoulders and is not hanging forward. Use a pillow on your lower back if necessary.  Try sleeping on your side, preferably the left side, with a pillow or two between your legs. If you are sore after a night's rest, your bed may be too soft. A firm mattress may provide more support for your back during pregnancy. General instructions  Do not wear high heels.  Eat a healthy diet. Try to gain weight within your health care provider's recommendations.  Use a maternity girdle, elastic sling, or back brace as told by your health care provider.  Take over-the-counter and prescription medicines only as told by your health care provider.  Keep all follow-up visits as told by your health care provider. This is important. This includes any visits with any specialists, such as a physical therapist. Contact a health care provider if:  Your back pain interferes with your daily activities.  You have increasing pain in other parts of your body. Get help right away if:  You develop numbness, tingling, weakness, or problems with the use of your arms or legs.  You develop severe back pain that is not controlled with medicine.  You have a sudden change in bowel or bladder control.  You develop shortness of breath, dizziness, or you faint.  You develop nausea, vomiting, or sweating.  You have back pain that is a rhythmic, cramping  pain similar to labor pains. Labor pain is usually 1-2 minutes apart, lasts for about 1 minute, and involves a bearing down feeling or pressure in your pelvis.  You have back pain and your water breaks or you have vaginal bleeding.  You have back pain or numbness that travels down your leg.  Your back pain developed after you fell.  You develop pain on one side of your back.  You see blood in your urine.  You develop skin blisters in the area of your back pain. This information is  not intended to replace advice given to you by your health care provider. Make sure you discuss any questions you have with your health care provider. Document Released: 09/09/2005 Document Revised: 11/07/2015 Document Reviewed: 02/13/2015 Elsevier Interactive Patient Education  2018 [employer]. Round Ligament Pain The round ligament is a cord of muscle and tissue that helps to support the uterus. It can become a source of pain during pregnancy if it becomes stretched or twisted as the baby grows. The pain usually begins in the second trimester of pregnancy, and it can come and go until the baby is delivered. It is not a serious problem, and it does not cause harm to the baby. Round ligament pain is usually a short, sharp, and pinching pain, but it can also be a dull, lingering, and aching pain. The pain is felt in the lower side of the abdomen or in the groin. It usually starts deep in the groin and moves up to the outside of the hip area. Pain can occur with:  A sudden change in position.  Rolling over in bed.  Coughing or sneezing.  Physical activity.  Follow these instructions at home: Watch your condition for any changes. Take these steps to help with your pain:  When the pain starts, relax. Then try: ? Sitting down. ? Flexing your knees up to your abdomen. ? Lying on your side with one pillow under your abdomen and another pillow between your legs. ? Sitting in a warm bath for 15-20 minutes or until the pain goes away.  Take over-the-counter and prescription medicines only as told by your health care provider.  Move slowly when you sit and stand.  Avoid long walks if they cause pain.  Stop or lessen your physical activities if they cause pain.  Contact a health care provider if:  Your pain does not go away with treatment.  You feel pain in your back that you did not have before.  Your medicine is not helping. Get help right away if:  You develop a fever or  chills.  You develop uterine contractions.  You develop vaginal bleeding.  You develop nausea or vomiting.  You develop diarrhea.  You have pain when you urinate. This information is not intended to replace advice given to you by your health care provider. Make sure you discuss any questions you have with your health care provider. Document Released: 03/10/2008 Document Revised: 11/07/2015 Document Reviewed: 08/08/2014 Elsevier Interactive Patient Education  2018 [employer]. Common Medications Safe in Pregnancy  Acne:      Constipation:  Benzoyl Peroxide     Colace  Clindamycin      Dulcolax Suppository  Topica Erythromycin     Fibercon  Salicylic Acid      Metamucil         Miralax AVOID:        Senakot   Accutane    Cough:  Retin-A  Cough Drops  Tetracycline      Phenergan w/ Codeine if Rx  Minocycline      Robitussin (Plain & DM)  Antibiotics:     Crabs/Lice:  Ceclor       RID  Cephalosporins    AVOID:  E-Mycins      Kwell  Keflex  Macrobid/Macrodantin   Diarrhea:  Penicillin      Kao-Pectate  Zithromax      Imodium AD         PUSH FLUIDS AVOID:       Cipro     Fever:  Tetracycline      Tylenol (Regular or Extra  Minocycline       Strength)  Levaquin      Extra Strength-Do not          Exceed 8 tabs/24 hrs Caffeine:        <217m/day (equiv. To 1 cup of coffee or  approx. 3 12 oz sodas)         Gas: Cold/Hayfever:       Gas-X  Benadryl      Mylicon  Claritin       Phazyme  **Claritin-D        Chlor-Trimeton    Headaches:  Dimetapp      ASA-Free Excedrin  Drixoral-Non-Drowsy     Cold Compress  Mucinex (Guaifenasin)     Tylenol (Regular or Extra  Sudafed/Sudafed-12 Hour     Strength)  **Sudafed PE Pseudoephedrine   Tylenol Cold & Sinus     Vicks Vapor Rub  Zyrtec  **AVOID if Problems With Blood Pressure         Heartburn: Avoid lying down for at least 1 hour after meals  Aciphex      Maalox     Rash:  Milk of  Magnesia     Benadryl    Mylanta       1% Hydrocortisone Cream  Pepcid  Pepcid Complete   Sleep Aids:  Prevacid      Ambien   Prilosec       Benadryl  Rolaids       Chamomile Tea  Tums (Limit 4/day)     Unisom  Zantac       Tylenol PM         Warm milk-add vanilla or  Hemorrhoids:       Sugar for taste  Anusol/Anusol H.C.  (RX: Analapram 2.5%)  Sugar Substitutes:  Hydrocortisone OTC     Ok in moderation  Preparation H      Tucks        Vaseline lotion applied to tissue with wiping    Herpes:     Throat:  Acyclovir      Oragel  Famvir  Valtrex     Vaccines:         Flu Shot Leg Cramps:       *Gardasil  Benadryl      Hepatitis A         Hepatitis B Nasal Spray:       Pneumovax  Saline Nasal Spray     Polio Booster         Tetanus Nausea:       Tuberculosis test or PPD  Vitamin B6 25 mg TID   AVOID:    Dramamine      *Gardasil  Emetrol       Live Poliovirus  Ginger Root 250 mg QID    MMR (measles, mumps &  High Complex Carbs @ Bedtime    rebella)  Sea Bands-Accupressure    Varicella (Chickenpox)  Unisom 1/2 tab TID     *No known complications           If received before Pain:         Known pregnancy;   Darvocet       Resume series after  Lortab        Delivery  Percocet    Yeast:   Tramadol      Femstat  Tylenol 3      Gyne-lotrimin  Ultram       Monistat  Vicodin           MISC:         All Sunscreens           Hair Coloring/highlights          Insect Repellant's          (Including DEET)         Mystic Tans

## 2017-12-30 NOTE — Progress Notes (Signed)
ROB-Doing well. Anatomy scan today incomplete, but normal; findings reviewed with patient and spouse. Anticipatory guidance regarding course of prenatal care. Discussed round ligament pain and home treatment measures including use of abdominal support. Reviewed red flag symptoms and when to call. RTC x 2 weeks for follow up US due to incomplete anatomy. RTC x 4 weeks for ROB or sooner if needed.   ULTRASOUND REPORT  Location: [facility] Date of Service:  12/30/2017  Indications: Anatomy Findings:  Singleton intrauterine pregnancy is visualized with FHR at 152 BPM. Biometrics give an (U/S) Gestational age of [redacted] weeks and an (U/S) EDD of 05/12/18; this correlates with the clinically established EDD of 05/18/18.  Fetal presentation is vertex.  EFW: 379 grams (0lb 13oz). Placenta: Anterior and grade 1. AFI: WNL subjectively.  Anatomic survey is incomplete for views of the posterior fossa, nose/lips, and profile.  All visualized anatomy on today's scan appears WNL. Gender - Female.   Right Ovary measures 2.8 x 2.0 x 2.0 cm. It is normal in appearance. Left Ovary measures 3.3 x 2.2 x 2.1 cm. It is normal appearance. There is no obvious evidence of a corpus luteal cyst. Survey of the adnexa demonstrates no adnexal masses. There is no free peritoneal fluid in the cul de sac.  Impression: 1. 21 week Viable Singleton Intrauterine pregnancy by U/S. 2. (U/S) EDD is consistent with Clinically established (LMP) EDD of 05/18/18. 3. Incomplete anatomy scan - Need views of the posterior fossa, nose/lips, and profile.  Recommendations: 1.Clinical correlation with the patient's History and Physical Exam. 2. F/U U/S in 2 weeks to complete anatomy scan.

## 2017-12-31 ENCOUNTER — Other Ambulatory Visit: Payer: Self-pay | Admitting: Certified Nurse Midwife

## 2017-12-31 ENCOUNTER — Encounter (INDEPENDENT_AMBULATORY_CARE_PROVIDER_SITE_OTHER): Payer: Self-pay

## 2017-12-31 DIAGNOSIS — Z0489 Encounter for examination and observation for other specified reasons: Secondary | ICD-10-CM

## 2017-12-31 DIAGNOSIS — IMO0002 Reserved for concepts with insufficient information to code with codable children: Secondary | ICD-10-CM

## 2018-01-12 ENCOUNTER — Other Ambulatory Visit: Payer: 59

## 2018-01-14 ENCOUNTER — Other Ambulatory Visit: Payer: 59

## 2018-01-17 ENCOUNTER — Ambulatory Visit (INDEPENDENT_AMBULATORY_CARE_PROVIDER_SITE_OTHER): Payer: 59

## 2018-01-17 DIAGNOSIS — Z0489 Encounter for examination and observation for other specified reasons: Secondary | ICD-10-CM

## 2018-01-17 DIAGNOSIS — Z3A23 23 weeks gestation of pregnancy: Secondary | ICD-10-CM

## 2018-01-17 DIAGNOSIS — IMO0002 Reserved for concepts with insufficient information to code with codable children: Secondary | ICD-10-CM

## 2018-01-17 DIAGNOSIS — Z362 Encounter for other antenatal screening follow-up: Secondary | ICD-10-CM | POA: Diagnosis not present

## 2018-01-28 ENCOUNTER — Encounter: Payer: Self-pay | Admitting: Certified Nurse Midwife

## 2018-01-28 ENCOUNTER — Ambulatory Visit (INDEPENDENT_AMBULATORY_CARE_PROVIDER_SITE_OTHER): Payer: 59 | Admitting: Certified Nurse Midwife

## 2018-01-28 VITALS — BP 109/77 | HR 94 | Wt 219.5 lb

## 2018-01-28 DIAGNOSIS — Z3492 Encounter for supervision of normal pregnancy, unspecified, second trimester: Secondary | ICD-10-CM | POA: Diagnosis not present

## 2018-01-28 LAB — POCT URINALYSIS DIPSTICK
BILIRUBIN UA: NEGATIVE
Blood, UA: NEGATIVE
Glucose, UA: NEGATIVE
Ketones, UA: NEGATIVE
Leukocytes, UA: NEGATIVE
Nitrite, UA: NEGATIVE
PH UA: 5 (ref 5.0–8.0)
Protein, UA: POSITIVE — AB
Spec Grav, UA: >=1.03 — AB (ref 1.010–1.025)
UROBILINOGEN UA: 0.2 U/dL

## 2018-01-28 NOTE — Patient Instructions (Signed)

## 2018-01-28 NOTE — Progress Notes (Signed)
ROB, complain of heel pain that burns when standing for long periods of time,despite wearing supportive shoes. Recommendation for compression socks and frequent rest periods.  Patient is using belly band for support but does not like it,reviewed proper use of the belly band. CBC/RPR/Glucola next visit. Anticipatory guidance given regarding prenatal course and childbirth classes. Questions answered and discussed.  Prior anatomy scan ,incomplete repeat anatomy scan completed on 01/17/2018, WNL, reviiewed with patient.  RTC X 4 weeks for ROB and 1gtt.   Tremar Wickens,CNM/Shanika Creacy,SNM.

## 2018-01-31 ENCOUNTER — Other Ambulatory Visit: Payer: Self-pay

## 2018-01-31 MED ORDER — CITRANATAL BLOOM 90-1 MG PO TABS: 1.0000 | ORAL_TABLET | Freq: Every day | ORAL | 6 refills | Status: DC

## 2018-02-24 ENCOUNTER — Other Ambulatory Visit (INDEPENDENT_AMBULATORY_CARE_PROVIDER_SITE_OTHER): Payer: 59

## 2018-02-24 ENCOUNTER — Ambulatory Visit (INDEPENDENT_AMBULATORY_CARE_PROVIDER_SITE_OTHER): Payer: 59 | Admitting: Certified Nurse Midwife

## 2018-02-24 ENCOUNTER — Encounter: Payer: 59 | Admitting: Certified Nurse Midwife

## 2018-02-24 ENCOUNTER — Other Ambulatory Visit: Payer: 59

## 2018-02-24 VITALS — BP 103/68 | HR 86 | Wt 220.5 lb

## 2018-02-24 DIAGNOSIS — O9989 Other specified diseases and conditions complicating pregnancy, childbirth and the puerperium: Secondary | ICD-10-CM

## 2018-02-24 DIAGNOSIS — R351 Nocturia: Secondary | ICD-10-CM

## 2018-02-24 DIAGNOSIS — Z23 Encounter for immunization: Secondary | ICD-10-CM | POA: Diagnosis not present

## 2018-02-24 DIAGNOSIS — Z3492 Encounter for supervision of normal pregnancy, unspecified, second trimester: Secondary | ICD-10-CM

## 2018-02-24 DIAGNOSIS — Z3A28 28 weeks gestation of pregnancy: Secondary | ICD-10-CM

## 2018-02-24 LAB — POCT URINALYSIS DIPSTICK OB
Bilirubin, UA: NEGATIVE
Blood, UA: NEGATIVE
GLUCOSE, UA: NEGATIVE
LEUKOCYTES UA: NEGATIVE
Nitrite, UA: NEGATIVE
POC,PROTEIN,UA: NEGATIVE
Spec Grav, UA: 1.02 (ref 1.010–1.025)
Urobilinogen, UA: 0.2 E.U./dL
pH, UA: 6 (ref 5.0–8.0)

## 2018-02-24 MED ORDER — TETANUS-DIPHTH-ACELL PERTUSSIS 5-2.5-18.5 LF-MCG/0.5 IM SUSP
0.5000 mL | Freq: Once | INTRAMUSCULAR | Status: AC
  Administered 2018-02-24: 0.5 mL via INTRAMUSCULAR

## 2018-02-24 NOTE — Patient Instructions (Signed)
Third Trimester of Pregnancy The third trimester is from week 29 through week 42, months 7 through 9. This trimester is when your unborn baby (fetus) is growing very fast. At the end of the ninth month, the unborn baby is about 20 inches in length. It weighs about 6-10 pounds. Follow these instructions at home:  Avoid all smoking, herbs, and alcohol. Avoid drugs not approved by your doctor.  Do not use any tobacco products, including cigarettes, chewing tobacco, and electronic cigarettes. If you need help quitting, ask your doctor. You may get counseling or other support to help you quit.  Only take medicine as told by your doctor. Some medicines are safe and some are not during pregnancy.  Exercise only as told by your doctor. Stop exercising if you start having cramps.  Eat regular, healthy meals.  Wear a good support bra if your breasts are tender.  Do not use hot tubs, steam rooms, or saunas.  Wear your seat belt when driving.  Avoid raw meat, uncooked cheese, and liter boxes and soil used by cats.  Take your prenatal vitamins.  Take 1500-2000 milligrams of calcium daily starting at the 20th week of pregnancy until you deliver your baby.  Try taking medicine that helps you poop (stool softener) as needed, and if your doctor approves. Eat more fiber by eating fresh fruit, vegetables, and whole grains. Drink enough fluids to keep your pee (urine) clear or pale yellow.  Take warm water baths (sitz baths) to soothe pain or discomfort caused by hemorrhoids. Use hemorrhoid cream if your doctor approves.  If you have puffy, bulging veins (varicose veins), wear support hose. Raise (elevate) your feet for 15 minutes, 3-4 times a day. Limit salt in your diet.  Avoid heavy lifting, wear low heels, and sit up straight.  Rest with your legs raised if you have leg cramps or low back pain.  Visit your dentist if you have not gone during your pregnancy. Use a soft toothbrush to brush your  teeth. Be gentle when you floss.  You can have sex (intercourse) unless your doctor tells you not to.  Do not travel far distances unless you must. Only do so with your doctor's approval.  Take prenatal classes.  Practice driving to the hospital.  Pack your hospital bag.  Prepare the baby's room.  Go to your doctor visits. Get help if:  You are not sure if you are in labor or if your water has broken.  You are dizzy.  You have mild cramps or pressure in your lower belly (abdominal).  You have a nagging pain in your belly area.  You continue to feel sick to your stomach (nauseous), throw up (vomit), or have watery poop (diarrhea).  You have bad smelling fluid coming from your vagina.  You have pain with peeing (urination). Get help right away if:  You have a fever.  You are leaking fluid from your vagina.  You are spotting or bleeding from your vagina.  You have severe belly cramping or pain.  You lose or gain weight rapidly.  You have trouble catching your breath and have chest pain.  You notice sudden or extreme puffiness (swelling) of your face, hands, ankles, feet, or legs.  You have not felt the baby move in over an hour.  You have severe headaches that do not go away with medicine.  You have vision changes. This information is not intended to replace advice given to you by your health care provider. Make   sure you discuss any questions you have with your health care provider. Document Released: 08/26/2009 Document Revised: 11/07/2015 Document Reviewed: 08/02/2012 Elsevier Interactive Patient Education  2017 [employer]. [facility]  [place], [place], [place] [address]  Phone: [phone]   [facility] (second location)  [address] [place], [place] [address]  Phone: [phone]   [facility]) [place], [place], [place] [address] Phone: [phone]   [facility] [address] [place], [place] [address]  Phone: [phone] tablets (contraception) What is this medicine? NORETHINDRONE (nor eth IN drone) is an oral contraceptive. The product contains a female hormone known as a progestin. It is used to prevent pregnancy. This medicine may be used for other purposes; ask your health care provider or pharmacist if you have questions. COMMON BRAND NAME(S): Camila, Deblitane 28-Day, Errin, Heather, [place], Jolivette, [place], Nor-QD, Nora-BE, Norlyroc, Ortho Micronor, [employer] 28-Day What should I tell my health care provider before I take this medicine? They need to know if you have any of these conditions: -blood vessel disease or blood clots -breast, cervical, or vaginal cancer -diabetes -heart disease -kidney disease -liver disease -mental depression -migraine -seizures -stroke -vaginal bleeding -an unusual or allergic reaction to norethindrone, other medicines, foods, dyes, or preservatives -pregnant or trying to get pregnant -breast-feeding How should I use this medicine? Take this medicine by mouth with a glass of water. You may take it with or without food. Follow the directions on the prescription label. Take this medicine at the same time each day and in the order directed on the package. Do not take your medicine more often than directed. Contact your pediatrician regarding the use of this medicine in children. Special care may be needed. This medicine has been used in female children who have started having menstrual periods. A patient package insert for the product will be given with each prescription and refill. Read this sheet carefully each time. The sheet may change frequently. Overdosage: If you think you have taken too much of this medicine contact a poison control center or emergency room at once. NOTE: This medicine is only for you. Do not share this medicine with others. What if  I miss a dose? Try not to miss a dose. Every time you miss a dose or take a dose late your chance of pregnancy increases. When 1 pill is missed (even if only 3 hours late), take the missed pill as soon as possible and continue taking a pill each day at the regular time (use a back up method of birth control for the next 48 hours). If more than 1 dose is missed, use an additional birth control method for the rest of your pill pack until menses occurs. Contact your health care professional if more than 1 dose has been missed. What may interact with this medicine? Do not take this medicine with any of the following medications: -amprenavir or fosamprenavir -bosentan This medicine may also interact with the following medications: -antibiotics or medicines for infections, especially rifampin, rifabutin, rifapentine, and griseofulvin, and possibly penicillins or tetracyclines -aprepitant -barbiturate medicines, such as phenobarbital -carbamazepine -felbamate -modafinil -oxcarbazepine -phenytoin -ritonavir or other medicines for HIV infection or AIDS -St. John's wort -topiramate This list may not describe all possible interactions. Give your health care provider a list of all the medicines, herbs, non-prescription drugs, or dietary supplements you use. Also tell them if you smoke, drink alcohol, or use  illegal drugs. Some items may interact with your medicine. What should I watch for while using this medicine? Visit your doctor or health care professional for regular checks on your progress. You will need a regular breast and pelvic exam and Pap smear while on this medicine. Use an additional method of birth control during the first cycle that you take these tablets. If you have any reason to think you are pregnant, stop taking this medicine right away and contact your doctor or health care professional. If you are taking this medicine for hormone related problems, it may take several cycles of use  to see improvement in your condition. This medicine does not protect you against HIV infection (AIDS) or any other sexually transmitted diseases. What side effects may I notice from receiving this medicine? Side effects that you should report to your doctor or health care professional as soon as possible: -breast tenderness or discharge -pain in the abdomen, chest, groin or leg -severe headache -skin rash, itching, or hives -sudden shortness of breath -unusually weak or tired -vision or speech problems -yellowing of skin or eyes Side effects that usually do not require medical attention (report to your doctor or health care professional if they continue or are bothersome): -changes in sexual desire -change in menstrual flow -facial hair growth -fluid retention and swelling -headache -irritability -nausea -weight gain or loss This list may not describe all possible side effects. Call your doctor for medical advice about side effects. You may report side effects to FDA at 1-800-FDA-1088. Where should I keep my medicine? Keep out of the reach of children. Store at room temperature between 15 and 30 degrees C (59 and 86 degrees F). Throw away any unused medicine after the expiration date. NOTE: This sheet is a summary. It may not cover all possible information. If you have questions about this medicine, talk to your doctor, pharmacist, or health care provider.  2018 Elsevier/Gold Standard (2012-02-19 16:41:35) Common Medications Safe in Pregnancy  Acne:      Constipation:  Benzoyl Peroxide     Colace  Clindamycin      Dulcolax Suppository  Topica Erythromycin     Fibercon  Salicylic Acid      Metamucil         Miralax AVOID:        Senakot   Accutane    Cough:  Retin-A       Cough Drops  Tetracycline      Phenergan w/ Codeine if Rx  Minocycline      Robitussin (Plain &  DM)  Antibiotics:     Crabs/Lice:  Ceclor       RID  Cephalosporins    AVOID:  E-Mycins      Kwell  Keflex  Macrobid/Macrodantin   Diarrhea:  Penicillin      Kao-Pectate  Zithromax      Imodium AD         PUSH FLUIDS AVOID:       Cipro     Fever:  Tetracycline      Tylenol (Regular or Extra  Minocycline       Strength)  Levaquin      Extra Strength-Do not          Exceed 8 tabs/24 hrs Caffeine:        '200mg'$ /day (equiv. To 1 cup of coffee or  approx. 3 12 oz sodas)         Gas: Cold/Hayfever:       Gas-X  Benadryl      Mylicon  Claritin       Phazyme  **Claritin-D        Chlor-Trimeton    Headaches:  Dimetapp      ASA-Free Excedrin  Drixoral-Non-Drowsy     Cold Compress  Mucinex (Guaifenasin)     Tylenol (Regular or Extra  Sudafed/Sudafed-12 Hour     Strength)  **Sudafed PE Pseudoephedrine   Tylenol Cold & Sinus     Vicks Vapor Rub  Zyrtec  **AVOID if Problems With Blood Pressure         Heartburn: Avoid lying down for at least 1 hour after meals  Aciphex      Maalox     Rash:  Milk of Magnesia     Benadryl    Mylanta       1% Hydrocortisone Cream  Pepcid  Pepcid Complete   Sleep Aids:  Prevacid      Ambien   Prilosec       Benadryl  Rolaids       Chamomile Tea  Tums (Limit 4/day)     Unisom  Zantac       Tylenol PM         Warm milk-add vanilla or  Hemorrhoids:       Sugar for taste  Anusol/Anusol H.C.  (RX: Analapram 2.5%)  Sugar Substitutes:  Hydrocortisone OTC     Ok in moderation  Preparation H      Tucks        Vaseline lotion applied to tissue with wiping    Herpes:     Throat:  Acyclovir      Oragel  Famvir  Valtrex     Vaccines:         Flu Shot Leg Cramps:       *Gardasil  Benadryl      Hepatitis A         Hepatitis B Nasal Spray:       Pneumovax  Saline Nasal Spray     Polio Booster         Tetanus Nausea:       Tuberculosis test or PPD  Vitamin B6 25 mg TID   AVOID:    Dramamine      *Gardasil  Emetrol       Live  Poliovirus  Ginger Root 250 mg QID    MMR (measles, mumps &  High Complex Carbs @ Bedtime    rebella)  Sea Bands-Accupressure    Varicella (Chickenpox)  Unisom 1/2 tab TID     *No known complications           If received before Pain:         Known pregnancy;   Darvocet       Resume series after  Lortab        Delivery  Percocet    Yeast:   Tramadol      Femstat  Tylenol 3      Gyne-lotrimin  Ultram       Monistat  Vicodin           MISC:         All Sunscreens           Hair Coloring/highlights          Insect Repellant's          (Including DEET)         Mystic Tans Pain Relief During Labor and Delivery Many things can cause  pain during labor and delivery, including:  Pressure on bones and ligaments due to the baby moving through the pelvis.  Stretching of tissues due to the baby moving through the birth canal.  Muscle tension due to anxiety or nervousness.  The uterus tightening (contracting) and relaxing to help move the baby.  There are many ways to deal with the pain of labor and delivery. They include:  Taking prenatal classes. Taking these classes helps you know what to expect during your baby's birth. What you learn will increase your confidence and decrease your anxiety.  Practicing relaxation techniques or doing relaxing activities, such as: ? Focused breathing. ? Meditation. ? Visualization. ? Aroma therapy. ? Listening to your favorite music. ? Hypnosis.  Taking a warm shower or bath (hydrotherapy). This may: ? Provide comfort and relaxation. ? Lessen your perception of pain. ? Decrease the amount of pain medicine needed. ? Decrease the length of labor.  Getting a massage or counterpressure on your back.  Applying warm packs or ice packs.  Changing positions often, moving around, or using a birthing ball.  Getting: ? Pain medicine through an IV or injection into a muscle. ? Pain medicine inserted into your spinal column. ? Injections of sterile  water just under the skin on your lower back (intradermal injections). ? Laughing gas (nitrous oxide).  Discuss your pain control options with your health care provider during your prenatal visits. Explore the options offered by your hospital or birth center. What kinds of medicine are available? There are two kinds of medicines that can be used to relieve pain during labor and delivery:  Analgesics. These medicines decrease pain without causing you to lose feeling or the ability to move your muscles.  Anesthetics. These medicines block feeling in the body and can decrease your ability to move freely.  Both of these kinds of medicine can cause minor side effects, such as nausea, trouble concentrating, and sleepiness. They can also decrease the baby's heart rate before birth and affect the baby's breathing rate after birth. For this reason, health care providers are careful about when and how much medicine is given. What are specific medicines and procedures that provide pain relief? Local Anesthetics Local anesthetics are used to numb a small area of the body. They may be used along with another kind of anesthetic or used to numb the nerves of the vagina, cervix, and perineum during the second stage of labor. General Anesthetics General anesthetics cause you to lose consciousness so you do not feel pain. They are usually only used for an emergency cesarean delivery. General anesthetics are given through an IV tube and a mask. Pudendal Block A pudendal block is a form of local anesthetic. It may be used to relieve the pain associated with pushing or stretching of the perineum at the time of delivery or to further numb the perineum. A pudendal block is done by injecting numbing medicine through the vaginal wall into a nerve in the pelvis. Epidural Analgesia Epidural analgesia is given through a flexible IV catheter that is inserted into the lower back. Numbing medicine is delivered continuously to  the area near your spinal column nerves (epidural space). After having this type of analgesia, you may be able to move your legs but you most likely will not be able to walk. Depending on the amount of medicine given, you may lose all feeling in the lower half of your body, or you may retain some level of sensation, including the urge to  push. Epidural analgesia can be used to provide pain relief for a vaginal birth. Spinal Block A spinal block is similar to epidural analgesia, but the medicine is injected into the spinal fluid instead of the epidural space. A spinal block is only given once. It starts to relieve pain quickly, but the pain relief lasts only 1-6 hours. Spinal blocks can be used for cesarean deliveries. Combined Spinal-Epidural (CSE) Block A CSE block combines the effects of a spinal block and epidural analgesia. The spinal block works quickly to block all pain. The epidural analgesia provides continuous pain relief, even after the effects of the spinal block have worn off. This information is not intended to replace advice given to you by your health care provider. Make sure you discuss any questions you have with your health care provider. Document Released: 09/17/2008 Document Revised: 11/08/2015 Document Reviewed: 10/23/2015 Elsevier Interactive Patient Education  2018 [employer]. WHAT OB PATIENTS CAN EXPECT   Confirmation of pregnancy and ultrasound ordered if medically indicated-[redacted] weeks gestation  New OB (NOB) intake with nurse and New OB (NOB) labs- [redacted] weeks gestation  New OB (NOB) physical examination with provider- 11/[redacted] weeks gestation  Flu vaccine-[redacted] weeks gestation  Anatomy scan-[redacted] weeks gestation  Glucose tolerance test, blood work to test for anemia, T-dap vaccine-[redacted] weeks gestation  Vaginal swabs/cultures-STD/Group B strep-[redacted] weeks gestation  Appointments every 4 weeks until 28 weeks  Every 2 weeks from 28 weeks until 36 weeks  Weekly visits from 36 weeks  until delivery

## 2018-02-25 ENCOUNTER — Other Ambulatory Visit: Payer: 59

## 2018-02-25 DIAGNOSIS — Z3492 Encounter for supervision of normal pregnancy, unspecified, second trimester: Secondary | ICD-10-CM | POA: Diagnosis not present

## 2018-02-25 LAB — GLUCOSE TOLERANCE, 1 HOUR: GLUCOSE, 1HR PP: 133 mg/dL (ref 65–199)

## 2018-02-26 LAB — URINE CULTURE: Organism ID, Bacteria: NO GROWTH

## 2018-02-26 LAB — CBC
HEMOGLOBIN: 11.9 g/dL (ref 11.1–15.9)
Hematocrit: 34.9 % (ref 34.0–46.6)
MCH: 30.5 pg (ref 26.6–33.0)
MCHC: 34.1 g/dL (ref 31.5–35.7)
MCV: 90 fL (ref 79–97)
PLATELETS: 228 10*3/uL (ref 150–450)
RBC: 3.9 x10E6/uL (ref 3.77–5.28)
RDW: 13.2 % (ref 12.3–15.4)
WBC: 14 10*3/uL — ABNORMAL HIGH (ref 3.4–10.8)

## 2018-02-26 LAB — RPR: RPR: NONREACTIVE

## 2018-02-28 NOTE — Progress Notes (Signed)
ROB-Reports nocturia; urine culture, see orders. 28 week labs today. TDaP given. Declined flu vaccine. Third trimester education, see AVS. Enrolled in CBC. Plans NCB. [facility] [employer] pamphlet given. Plans breastfeeding and POP. Desires circumcision of son. Anticipatory guidance regarding course of prenatal care. Reviewed red flag symptoms and when to call. RTC x 2 weeks for ROB or sooner if needed.

## 2018-03-11 ENCOUNTER — Ambulatory Visit (INDEPENDENT_AMBULATORY_CARE_PROVIDER_SITE_OTHER): Payer: 59 | Admitting: Certified Nurse Midwife

## 2018-03-11 ENCOUNTER — Encounter: Payer: Self-pay | Admitting: Certified Nurse Midwife

## 2018-03-11 VITALS — BP 114/71 | HR 90 | Wt 221.1 lb

## 2018-03-11 DIAGNOSIS — Z3492 Encounter for supervision of normal pregnancy, unspecified, second trimester: Secondary | ICD-10-CM

## 2018-03-11 LAB — POCT URINALYSIS DIPSTICK OB
Bilirubin, UA: NEGATIVE
Blood, UA: NEGATIVE
Glucose, UA: NEGATIVE
Ketones, UA: NEGATIVE
LEUKOCYTES UA: NEGATIVE
NITRITE UA: NEGATIVE
PH UA: 6.5 (ref 5.0–8.0)
POC,PROTEIN,UA: NEGATIVE
SPEC GRAV UA: 1.02 (ref 1.010–1.025)
UROBILINOGEN UA: 0.2 U/dL

## 2018-03-11 NOTE — Patient Instructions (Signed)
[facility]  [facility]  [address], [place], [place] [address]  Phone: [phone]  [facility] (second location)  [address] [place], [place] [address]  Phone: [phone]  [facility]) [address], [place], [place] [address] Phone: [phone]  [facility]  [address] [place], [place] [address]  Phone: [phone] 

## 2018-03-11 NOTE — Progress Notes (Signed)
ROB doing well. Feels good movement. Has no complaints. Follow up 2 wks.   [name], CNM

## 2018-03-25 ENCOUNTER — Ambulatory Visit (INDEPENDENT_AMBULATORY_CARE_PROVIDER_SITE_OTHER): Payer: 59 | Admitting: Obstetrics and Gynecology

## 2018-03-25 VITALS — BP 105/78 | HR 86 | Wt 223.5 lb

## 2018-03-25 DIAGNOSIS — Z3493 Encounter for supervision of normal pregnancy, unspecified, third trimester: Secondary | ICD-10-CM | POA: Diagnosis not present

## 2018-03-25 LAB — POCT URINALYSIS DIPSTICK OB
Bilirubin, UA: NEGATIVE
Blood, UA: NEGATIVE
Glucose, UA: NEGATIVE
Ketones, UA: 5
Leukocytes, UA: NEGATIVE
Nitrite, UA: NEGATIVE
POC,PROTEIN,UA: NEGATIVE
Spec Grav, UA: 1.01
Urobilinogen, UA: 0.2 U/dL
pH, UA: 6.5

## 2018-03-25 MED ORDER — PANTOPRAZOLE SODIUM 40 MG PO TBEC: 40.0000 mg | DELAYED_RELEASE_TABLET | Freq: Every day | ORAL | 5 refills | Status: DC

## 2018-03-25 NOTE — Progress Notes (Signed)
ROB- pt is having bad heart burn, would like something for that

## 2018-03-25 NOTE — Patient Instructions (Signed)
Heartburn During Pregnancy Heartburn is pain or discomfort in the throat or chest. It may cause a burning feeling. It happens when stomach acid moves up into the tube that carries food from your mouth to your stomach (esophagus). Heartburn is common during pregnancy. It usually goes away or gets better after giving birth. Follow these instructions at home: Eating and drinking  Do not drink alcohol while you are pregnant.  Figure out which foods and beverages make you feel worse, and avoid them.  Beverages that you may want to avoid include: ? Coffee and tea (with or without caffeine). ? Energy drinks and sports drinks. ? Bubbly (carbonated) drinks or sodas. ? Citrus fruit juices.  Foods that you may want to avoid include: ? Chocolate and cocoa. ? Peppermint and mint flavorings. ? Garlic, onions, and horseradish. ? Spicy and acidic foods. These include peppers, chili powder, curry powder, vinegar, hot sauces, and barbecue sauce. ? Citrus fruits, such as oranges, lemons, and limes. ? Tomato-based foods, such as red sauce, chili, and salsa. ? Fried and fatty foods, such as donuts, french fries, potato chips, and high-fat dressings. ? High-fat meats, such as hot dogs, cold cuts, sausage, ham, and bacon. ? High-fat dairy items, such as whole milk, butter, and cheese.  Eat small meals often, instead of large meals.  Avoid drinking a lot of liquid with your meals.  Avoid eating meals during the 2-3 hours before you go to bed.  Avoid lying down right after you eat.  Do not exercise right after you eat. Medicines  Take over-the-counter and prescription medicines only as told by your doctor.  Do not take aspirin, ibuprofen, or other NSAIDs unless your doctor tells you to do that.  Your doctor may tell you to avoid medicines that have sodium bicarbonate in them. General instructions  If told, raise the head of your bed about 6 inches (15 cm). You can do this by putting blocks under  the legs. Sleeping with more pillows does not help with heartburn.  Do not use any products that contain nicotine or tobacco, such as cigarettes and e-cigarettes. If you need help quitting, ask your doctor.  Wear loose-fitting clothing.  Try to lower your stress, such as with yoga or meditation. If you need help, ask your doctor.  Stay at a healthy weight. If you are overweight, work with your doctor to safely lose weight.  Keep all follow-up visits as told by your doctor. This is important. Contact a doctor if:  You get new symptoms.  Your symptoms do not get better with treatment.  You have weight loss and you do not know why.  You have trouble swallowing.  You make loud sounds when you breathe (wheeze).  You have a cough that does not go away.  You have heartburn often for more than 2 weeks.  You feel sick to your stomach (nauseous), and this does not get better with treatment.  You are throwing up (vomiting), and this does not get better with treatment.  You have pain in your belly (abdomen). Get help right away if:  You have very bad chest pain that spreads to your arm, neck, or jaw.  You feel sweaty, dizzy, or light-headed.  You have trouble breathing.  You have pain when swallowing.  You throw up and your throw-up looks like blood or coffee grounds.  Your poop (stool) is bloody or black. This information is not intended to replace advice given to you by your health care provider.   Make sure you discuss any questions you have with your health care provider. Document Released: 07/04/2010 Document Revised: 02/17/2016 Document Reviewed: 02/17/2016 Elsevier Interactive Patient Education  2017 Else[employer] Inc.  

## 2018-03-25 NOTE — Progress Notes (Signed)
ROB - discussed common treatments for heartburn, pepcid and zantac not helping.protonix prescribed.

## 2018-04-04 DIAGNOSIS — Z3482 Encounter for supervision of other normal pregnancy, second trimester: Secondary | ICD-10-CM | POA: Diagnosis not present

## 2018-04-04 DIAGNOSIS — Z3483 Encounter for supervision of other normal pregnancy, third trimester: Secondary | ICD-10-CM | POA: Diagnosis not present

## 2018-04-08 ENCOUNTER — Ambulatory Visit (INDEPENDENT_AMBULATORY_CARE_PROVIDER_SITE_OTHER): Payer: 59 | Admitting: Certified Nurse Midwife

## 2018-04-08 VITALS — BP 114/75 | HR 85 | Wt 226.5 lb

## 2018-04-08 DIAGNOSIS — Z3493 Encounter for supervision of normal pregnancy, unspecified, third trimester: Secondary | ICD-10-CM | POA: Diagnosis not present

## 2018-04-08 LAB — POCT URINALYSIS DIPSTICK OB
Bilirubin, UA: NEGATIVE
Glucose, UA: NEGATIVE
Ketones, UA: NEGATIVE
LEUKOCYTES UA: NEGATIVE
NITRITE UA: NEGATIVE
PH UA: 6.5 (ref 5.0–8.0)
RBC UA: NEGATIVE
SPEC GRAV UA: 1.015 (ref 1.010–1.025)
UROBILINOGEN UA: 0.2 U/dL

## 2018-04-08 NOTE — Progress Notes (Signed)
ROB, doing well. Feels good movement. Anticipatory guidance for 36 wk visit. Pt states that the women in her family have had c sections due to babies being breech. Discussed location of fetal heart rate and with palpation head feels down. Explained that at next visit SVE will be offered and this will help confirm presentation. She verbalizes and agrees to plan. Follow up 2 wks .  [name], CNM

## 2018-04-08 NOTE — Patient Instructions (Signed)
Group B Streptococcus Infection During Pregnancy Group B Streptococcus (GBS) is a type of bacteria (Streptococcus agalactiae) that is often found in healthy people, commonly in the rectum, vagina, and intestines. In people who are healthy and not pregnant, the bacteria rarely cause serious illness or complications. However, women who test positive for GBS during pregnancy can pass the bacteria to their baby during childbirth, which can cause serious infection in the baby after birth. Women with GBS may also have infections during their pregnancy or immediately after childbirth, such as such as urinary tract infections (UTIs) or infections of the uterus (uterine infections). Having GBS also increases a woman's risk of complications during pregnancy, such as early (preterm) labor or delivery, miscarriage, or stillbirth. Routine testing (screening) for GBS is recommended for all pregnant women. What increases the risk? You may have a higher risk for GBS infection during pregnancy if you had one during a past pregnancy. What are the signs or symptoms? In most cases, GBS infection does not cause symptoms in pregnant women. Signs and symptoms of a possible GBS-related infection may include:  Labor starting before the 37th week of pregnancy.  A UTI or bladder infection, which may cause: ? Fever. ? Pain or burning during urination. ? Frequent urination.  Fever during labor, along with: ? Bad-smelling discharge. ? Uterine tenderness. ? Rapid heartbeat in the mother, baby, or both.  Rare but serious symptoms of a possible GBS-related infection in women include:  Blood infection (septicemia). This may cause fever, chills, or confusion.  Lung infection (pneumonia). This may cause fever, chills, cough, rapid breathing, difficulty breathing, or chest pain.  Bone, joint, skin, or soft tissue infection.  How is this diagnosed? You may be screened for GBS between week 35 and week 37 of your pregnancy. If  you have symptoms of preterm labor, you may be screened earlier. This condition is diagnosed based on lab test results from:  A swab of fluid from the vagina and rectum.  A urine sample.  How is this treated? This condition is treated with antibiotic medicine. When you go into labor, or as soon as your water breaks (your membranes rupture), you will be given antibiotics through an IV tube. Antibiotics will continue until after you give birth. If you are having a cesarean delivery, you do not need antibiotics unless your membranes have already ruptured. Follow these instructions at home:  Take over-the-counter and prescription medicines only as told by your health care provider.  Take your antibiotic medicine as told by your health care provider. Do not stop taking the antibiotic even if you start to feel better.  Keep all pre-birth (prenatal) visits and follow-up visits as told by your health care provider. This is important. Contact a health care provider if:  You have pain or burning when you urinate.  You have to urinate frequently.  You have a fever or chills.  You develop a bad-smelling vaginal discharge. Get help right away if:  Your membranes rupture.  You go into labor.  You have severe pain in your abdomen.  You have difficulty breathing.  You have chest pain. This information is not intended to replace advice given to you by your health care provider. Make sure you discuss any questions you have with your health care provider. Document Released: 09/08/2007 Document Revised: 12/27/2015 Document Reviewed: 12/26/2015 Elsevier Interactive Patient Education  2018 [employer] Inc.  

## 2018-04-19 ENCOUNTER — Ambulatory Visit (INDEPENDENT_AMBULATORY_CARE_PROVIDER_SITE_OTHER): Payer: 59 | Admitting: Certified Nurse Midwife

## 2018-04-19 DIAGNOSIS — Z3493 Encounter for supervision of normal pregnancy, unspecified, third trimester: Secondary | ICD-10-CM | POA: Diagnosis not present

## 2018-04-19 LAB — COMPREHENSIVE METABOLIC PANEL
A/G RATIO: 1.5 (ref 1.2–2.2)
ALT: 12 IU/L (ref 0–32)
AST: 12 IU/L (ref 0–40)
Albumin: 3.4 g/dL — ABNORMAL LOW (ref 3.5–5.5)
Alkaline Phosphatase: 154 IU/L — ABNORMAL HIGH (ref 39–117)
BILIRUBIN TOTAL: 0.3 mg/dL (ref 0.0–1.2)
BUN / CREAT RATIO: 8 — AB (ref 9–23)
BUN: 5 mg/dL — ABNORMAL LOW (ref 6–20)
CALCIUM: 9.2 mg/dL (ref 8.7–10.2)
CHLORIDE: 104 mmol/L (ref 96–106)
CO2: 21 mmol/L (ref 20–29)
Creatinine, Ser: 0.61 mg/dL (ref 0.57–1.00)
GFR calc Af Amer: 137 mL/min/{1.73_m2} (ref >59)
GFR calc non Af Amer: 119 mL/min/{1.73_m2} (ref >59)
Globulin, Total: 2.2 g/dL (ref 1.5–4.5)
Glucose: 84 mg/dL (ref 65–99)
POTASSIUM: 3.8 mmol/L (ref 3.5–5.2)
SODIUM: 141 mmol/L (ref 134–144)
Total Protein: 5.6 g/dL — ABNORMAL LOW (ref 6.0–8.5)

## 2018-04-19 LAB — CBC WITH DIFFERENTIAL/PLATELET
BASOS ABS: 0.1 10*3/uL (ref 0.0–0.2)
Basos: 0 %
EOS (ABSOLUTE): 0.1 10*3/uL (ref 0.0–0.4)
EOS: 1 %
HEMATOCRIT: 35.2 % (ref 34.0–46.6)
Hemoglobin: 12 g/dL (ref 11.1–15.9)
IMMATURE GRANULOCYTES: 1 %
Immature Grans (Abs): 0.1 10*3/uL (ref 0.0–0.1)
LYMPHS ABS: 2.2 10*3/uL (ref 0.7–3.1)
Lymphs: 18 %
MCH: 30 pg (ref 26.6–33.0)
MCHC: 34.1 g/dL (ref 31.5–35.7)
MCV: 88 fL (ref 79–97)
MONOCYTES: 5 %
MONOS ABS: 0.6 10*3/uL (ref 0.1–0.9)
NEUTROS PCT: 75 %
Neutrophils Absolute: 9 10*3/uL — ABNORMAL HIGH (ref 1.4–7.0)
Platelets: 192 10*3/uL (ref 150–450)
RBC: 4 x10E6/uL (ref 3.77–5.28)
RDW: 13.4 % (ref 12.3–15.4)
WBC: 12 10*3/uL — AB (ref 3.4–10.8)

## 2018-04-19 LAB — PROTEIN / CREATININE RATIO, URINE
Creatinine, Urine: 78.9 mg/dL
Protein, Ur: 17.9 mg/dL
Protein/Creat Ratio: 227 mg/g creat — ABNORMAL HIGH (ref 0–200)

## 2018-04-19 LAB — POCT URINALYSIS DIPSTICK
Bilirubin, UA: NEGATIVE
GLUCOSE UA: NEGATIVE
LEUKOCYTES UA: NEGATIVE
NITRITE UA: NEGATIVE
Protein, UA: NEGATIVE
RBC UA: NEGATIVE
Spec Grav, UA: 1.01 (ref 1.010–1.025)
Urobilinogen, UA: 0.2 E.U./dL
pH, UA: 7 (ref 5.0–8.0)

## 2018-04-19 MED ORDER — BUTALBITAL-APAP-CAFFEINE 50-325-40 MG PO TABS: 1.0000 | ORAL_TABLET | Freq: Four times a day (QID) | ORAL | 0 refills | Status: DC | PRN

## 2018-04-19 NOTE — Patient Instructions (Signed)
Preeclampsia and Eclampsia °Preeclampsia is a serious condition that develops only during pregnancy. It is also called toxemia of pregnancy. This condition causes high blood pressure along with other symptoms, such as swelling and headaches. These symptoms may develop as the condition gets worse. Preeclampsia may occur at 20 weeks of pregnancy or later. °Diagnosing and treating preeclampsia early is very important. If not treated early, it can cause serious problems for you and your baby. One problem it can lead to is eclampsia, which is a condition that causes muscle jerking or shaking (convulsions or seizures) in the mother. Delivering your baby is the best treatment for preeclampsia or eclampsia. Preeclampsia and eclampsia symptoms usually go away after your baby is born. °What are the causes? °The cause of preeclampsia is not known. °What increases the risk? °The following risk factors make you more likely to develop preeclampsia: °· Being pregnant for the first time. °· Having had preeclampsia during a past pregnancy. °· Having a family history of preeclampsia. °· Having high blood pressure. °· Being pregnant with twins or triplets. °· Being 35 or older. °· Being African-American. °· Having kidney disease or diabetes. °· Having medical conditions such as lupus or blood diseases. °· Being very overweight (obese). ° °What are the signs or symptoms? °The earliest signs of preeclampsia are: °· High blood pressure. °· Increased protein in your urine. Your health care provider will check for this at every visit before you give birth (prenatal visit). ° °Other symptoms that may develop as the condition gets worse include: °· Severe headaches. °· Sudden weight gain. °· Swelling of the hands, face, legs, and feet. °· Nausea and vomiting. °· Vision problems, such as blurred or double vision. °· Numbness in the face, arms, legs, and feet. °· Urinating less than usual. °· Dizziness. °· Slurred speech. °· Abdominal pain,  especially upper abdominal pain. °· Convulsions or seizures. ° °Symptoms generally go away after giving birth. °How is this diagnosed? °There are no screening tests for preeclampsia. Your health care provider will ask you about symptoms and check for signs of preeclampsia during your prenatal visits. You may also have tests that include: °· Urine tests. °· Blood tests. °· Checking your blood pressure. °· Monitoring your baby’s heart rate. °· Ultrasound. ° °How is this treated? °You and your health care provider will determine the treatment approach that is best for you. Treatment may include: °· Having more frequent prenatal exams to check for signs of preeclampsia, if you have an increased risk for preeclampsia. °· Bed rest. °· Reducing how much salt (sodium) you eat. °· Medicine to lower your blood pressure. °· Staying in the hospital, if your condition is severe. There, treatment will focus on controlling your blood pressure and the amount of fluids in your body (fluid retention). °· You may need to take medicine (magnesium sulfate) to prevent seizures. This medicine may be given as an injection or through an IV tube. °· Delivering your baby early, if your condition gets worse. You may have your labor started with medicine (induced), or you may have a cesarean delivery. ° °Follow these instructions at home: °Eating and drinking ° °· Drink enough fluid to keep your urine clear or pale yellow. °· Eat a healthy diet that is low in sodium. Do not add salt to your food. Check nutrition labels to see how much sodium a food or beverage contains. °· Avoid caffeine. °Lifestyle °· Do not use any products that contain nicotine or tobacco, such as cigarettes   and e-cigarettes. If you need help quitting, ask your health care provider. °· Do not use alcohol or drugs. °· Avoid stress as much as possible. Rest and get plenty of sleep. °General instructions °· Take over-the-counter and prescription medicines only as told by your  health care provider. °· When lying down, lie on your side. This keeps pressure off of your baby. °· When sitting or lying down, raise (elevate) your feet. Try putting some pillows underneath your lower legs. °· Exercise regularly. Ask your health care provider what kinds of exercise are best for you. °· Keep all follow-up and prenatal visits as told by your health care provider. This is important. °How is this prevented? °To prevent preeclampsia or eclampsia from developing during another pregnancy: °· Get proper medical care during pregnancy. Your health care provider may be able to prevent preeclampsia or diagnose and treat it early. °· Your health care provider may have you take a low-dose aspirin or a calcium supplement during your next pregnancy. °· You may have tests of your blood pressure and kidney function after giving birth. °· Maintain a healthy weight. Ask your health care provider for help managing weight gain during pregnancy. °· Work with your health care provider to manage any long-term (chronic) health conditions you have, such as diabetes or kidney problems. ° °Contact a health care provider if: °· You gain more weight than expected. °· You have headaches. °· You have nausea or vomiting. °· You have abdominal pain. °· You feel dizzy or light-headed. °Get help right away if: °· You develop sudden or severe swelling anywhere in your body. This usually happens in the legs. °· You gain 5 lbs (2.3 kg) or more during one week. °· You have severe: °? Abdominal pain. °? Headaches. °? Dizziness. °? Vision problems. °? Confusion. °? Nausea or vomiting. °· You have a seizure. °· You have trouble moving any part of your body. °· You develop numbness in any part of your body. °· You have trouble speaking. °· You have any abnormal bleeding. °· You pass out. °This information is not intended to replace advice given to you by your health care provider. Make sure you discuss any questions you have with your health  care provider. °Document Released: 05/29/2000 Document Revised: 01/28/2016 Document Reviewed: 01/06/2016 °Elsevier Interactive Patient Education © 2018 Elsevier Inc. ° °

## 2018-04-19 NOTE — Progress Notes (Signed)
Pt presents today for problem visit. She states she woke up with headache this morning and has taken 2 tylenol. She denies visual changes. She denies epigastric pain. Reflexes 2+ bilaterally, Swelling pitting bilaterally to the knee. No clonus. Discussed self help for headache and prescription for 5 Fioricet ordered. Labs ordered today. Reviewed red flag symptoms. Follow up 1 wk or sooner if needed.   [name], CNM

## 2018-04-20 ENCOUNTER — Ambulatory Visit (INDEPENDENT_AMBULATORY_CARE_PROVIDER_SITE_OTHER): Payer: 59 | Admitting: Certified Nurse Midwife

## 2018-04-20 ENCOUNTER — Encounter: Payer: 59 | Admitting: Obstetrics and Gynecology

## 2018-04-20 ENCOUNTER — Other Ambulatory Visit: Payer: Self-pay | Admitting: Certified Nurse Midwife

## 2018-04-20 VITALS — BP 116/88 | HR 97 | Ht 68.0 in | Wt 228.1 lb

## 2018-04-20 DIAGNOSIS — Z7689 Persons encountering health services in other specified circumstances: Secondary | ICD-10-CM

## 2018-04-20 MED ORDER — BETAMETHASONE SOD PHOS & ACET 6 (3-3) MG/ML IJ SUSP: 12.0000 mg | Freq: Once | INTRAMUSCULAR | Status: DC

## 2018-04-20 MED ORDER — BETAMETHASONE SOD PHOS & ACET 6 (3-3) MG/ML IJ SUSP
12.5000 mg | INTRAMUSCULAR | Status: AC
  Administered 2018-04-20: 12.5 mg via INTRAMUSCULAR

## 2018-04-20 NOTE — Progress Notes (Signed)
Pt called with results. Discussed plan of care to have betamethasone she verbalizes and agrees to plan . She will come in today and tomorrow for injections.   [name], CNM

## 2018-04-20 NOTE — Progress Notes (Signed)
Patient presents today for first dose of betamethasone injection, aware next dose due in 24 hours.

## 2018-04-21 ENCOUNTER — Ambulatory Visit (INDEPENDENT_AMBULATORY_CARE_PROVIDER_SITE_OTHER): Payer: 59 | Admitting: Certified Nurse Midwife

## 2018-04-21 VITALS — BP 111/75 | HR 97 | Ht 68.0 in | Wt 228.4 lb

## 2018-04-21 DIAGNOSIS — O26893 Other specified pregnancy related conditions, third trimester: Secondary | ICD-10-CM

## 2018-04-21 DIAGNOSIS — Z3493 Encounter for supervision of normal pregnancy, unspecified, third trimester: Secondary | ICD-10-CM

## 2018-04-21 DIAGNOSIS — O218 Other vomiting complicating pregnancy: Secondary | ICD-10-CM | POA: Diagnosis not present

## 2018-04-21 DIAGNOSIS — Z7689 Persons encountering health services in other specified circumstances: Secondary | ICD-10-CM

## 2018-04-21 DIAGNOSIS — R300 Dysuria: Secondary | ICD-10-CM | POA: Diagnosis not present

## 2018-04-21 LAB — GC/CHLAMYDIA PROBE AMP
CHLAMYDIA, DNA PROBE: NEGATIVE
Neisseria gonorrhoeae by PCR: NEGATIVE

## 2018-04-21 LAB — POCT URINALYSIS DIPSTICK OB
BILIRUBIN UA: NEGATIVE
Blood, UA: NEGATIVE
Glucose, UA: NEGATIVE
Leukocytes, UA: NEGATIVE
Nitrite, UA: NEGATIVE
Spec Grav, UA: 1.025 (ref 1.010–1.025)
Urobilinogen, UA: 0.2 E.U./dL
pH, UA: 6 (ref 5.0–8.0)

## 2018-04-21 LAB — STREP GP B NAA: Strep Gp B NAA: NEGATIVE

## 2018-04-21 MED ORDER — BETAMETHASONE SOD PHOS & ACET 6 (3-3) MG/ML IJ SUSP
12.0000 mg | Freq: Once | INTRAMUSCULAR | Status: AC
  Administered 2018-04-21: 12 mg via INTRAMUSCULAR

## 2018-04-21 NOTE — Progress Notes (Signed)
Pt presented for a NV for #2 betamethasone. Also c/o burning on urination. Denies fever or other symptoms. Urine culture sent to lab.

## 2018-04-23 LAB — URINE CULTURE

## 2018-04-27 ENCOUNTER — Other Ambulatory Visit: Payer: Self-pay

## 2018-04-27 ENCOUNTER — Telehealth: Payer: Self-pay | Admitting: Certified Nurse Midwife

## 2018-04-27 ENCOUNTER — Observation Stay
Admission: EM | Admit: 2018-04-27 | Discharge: 2018-04-27 | Disposition: A | Discharge status: Home / Self Care | Payer: 59 | Attending: Certified Nurse Midwife | Admitting: Certified Nurse Midwife

## 2018-04-27 ENCOUNTER — Other Ambulatory Visit: Payer: Self-pay | Admitting: Certified Nurse Midwife

## 2018-04-27 ENCOUNTER — Ambulatory Visit (INDEPENDENT_AMBULATORY_CARE_PROVIDER_SITE_OTHER): Payer: 59 | Admitting: Obstetrics and Gynecology

## 2018-04-27 VITALS — BP 142/104 | HR 81 | Wt 230.9 lb

## 2018-04-27 DIAGNOSIS — Z3A37 37 weeks gestation of pregnancy: Secondary | ICD-10-CM | POA: Insufficient documentation

## 2018-04-27 DIAGNOSIS — O1493 Unspecified pre-eclampsia, third trimester: Secondary | ICD-10-CM

## 2018-04-27 DIAGNOSIS — Z3403 Encounter for supervision of normal first pregnancy, third trimester: Principal | ICD-10-CM | POA: Insufficient documentation

## 2018-04-27 DIAGNOSIS — O1490 Unspecified pre-eclampsia, unspecified trimester: Secondary | ICD-10-CM

## 2018-04-27 DIAGNOSIS — Z3493 Encounter for supervision of normal pregnancy, unspecified, third trimester: Secondary | ICD-10-CM

## 2018-04-27 LAB — CBC
HEMATOCRIT: 33.4 % — AB (ref 36.0–46.0)
Hemoglobin: 11.2 g/dL — ABNORMAL LOW (ref 12.0–15.0)
MCH: 29.6 pg (ref 26.0–34.0)
MCHC: 33.5 g/dL (ref 30.0–36.0)
MCV: 88.1 fL (ref 80.0–100.0)
NRBC: 0 % (ref 0.0–0.2)
Platelets: 194 10*3/uL (ref 150–400)
RBC: 3.79 MIL/uL — ABNORMAL LOW (ref 3.87–5.11)
RDW: 13.7 % (ref 11.5–15.5)
WBC: 11.5 10*3/uL — ABNORMAL HIGH (ref 4.0–10.5)

## 2018-04-27 LAB — POCT URINALYSIS DIPSTICK OB
Bilirubin, UA: NEGATIVE
Blood, UA: NEGATIVE
GLUCOSE, UA: NEGATIVE
Ketones, UA: NEGATIVE
LEUKOCYTES UA: NEGATIVE
Nitrite, UA: NEGATIVE
SPEC GRAV UA: 1.01 (ref 1.010–1.025)
Urobilinogen, UA: 0.2 E.U./dL
pH, UA: 6.5 (ref 5.0–8.0)

## 2018-04-27 LAB — COMPREHENSIVE METABOLIC PANEL
ALK PHOS: 156 U/L — AB (ref 38–126)
ALT: 15 U/L (ref 0–44)
ANION GAP: 9 (ref 5–15)
AST: 20 U/L (ref 15–41)
Albumin: 2.9 g/dL — ABNORMAL LOW (ref 3.5–5.0)
BUN: 5 mg/dL — ABNORMAL LOW (ref 6–20)
CALCIUM: 9.1 mg/dL (ref 8.9–10.3)
CHLORIDE: 104 mmol/L (ref 98–111)
CO2: 23 mmol/L (ref 22–32)
CREATININE: 0.57 mg/dL (ref 0.44–1.00)
GFR calc Af Amer: >60 mL/min (ref >60)
GFR calc non Af Amer: >60 mL/min (ref >60)
GLUCOSE: 84 mg/dL (ref 70–99)
Potassium: 3.6 mmol/L (ref 3.5–5.1)
Sodium: 136 mmol/L (ref 135–145)
Total Bilirubin: 0.6 mg/dL (ref 0.3–1.2)
Total Protein: 6.1 g/dL — ABNORMAL LOW (ref 6.5–8.1)

## 2018-04-27 LAB — PROTEIN / CREATININE RATIO, URINE
Creatinine, Urine: 50 mg/dL
PROTEIN CREATININE RATIO: 0.2 mg/mg{creat} — AB (ref 0.00–0.15)
TOTAL PROTEIN, URINE: 10 mg/dL

## 2018-04-27 LAB — TYPE AND SCREEN
ABO/RH(D): A POS
ANTIBODY SCREEN: NEGATIVE

## 2018-04-27 NOTE — OB Triage Note (Signed)
Pt is a G1P0 at 1462w0d that was sent over from [facility] with elevated blood pressures. Pt denies Headache, epigastric pain, and blurred vision. Initial BP 137/90 with pressures cycling every 15 minutes. Pt denies VB, LOF and states positive FM. Pt has 2+ pitting edema in lower extremities with  2 plus reflexes and no clonus. Initial FHT 135 with monitors applied and assessing.

## 2018-04-27 NOTE — Telephone Encounter (Signed)
The patient called and stated that she is 37 weeks preg and that she is experiencing pain in her upper stomach where her liver is, and she is also concerned about a sharp pain in her chest  Between her breasts, the patient believes that she may have preeclampsia. The patient left this message with the on call overnight phone answering service. No other information was disclosed.   Copy of message left in Annie's basket as well.   Please advise.

## 2018-04-27 NOTE — Progress Notes (Signed)
ROB- BP readings at home in the 130-140s/80-90s. Counseled on preeclampsia and f]todays findings, sent to L&D for serial BP and labs and NST, if stable will d/c home on bedrest with bi-weekly testing, if not will proceed with delivery.

## 2018-04-27 NOTE — Patient Instructions (Signed)
Preeclampsia and Eclampsia °Preeclampsia is a serious condition that develops only during pregnancy. It is also called toxemia of pregnancy. This condition causes high blood pressure along with other symptoms, such as swelling and headaches. These symptoms may develop as the condition gets worse. Preeclampsia may occur at 20 weeks of pregnancy or later. °Diagnosing and treating preeclampsia early is very important. If not treated early, it can cause serious problems for you and your baby. One problem it can lead to is eclampsia, which is a condition that causes muscle jerking or shaking (convulsions or seizures) in the mother. Delivering your baby is the best treatment for preeclampsia or eclampsia. Preeclampsia and eclampsia symptoms usually go away after your baby is born. °What are the causes? °The cause of preeclampsia is not known. °What increases the risk? °The following risk factors make you more likely to develop preeclampsia: °· Being pregnant for the first time. °· Having had preeclampsia during a past pregnancy. °· Having a family history of preeclampsia. °· Having high blood pressure. °· Being pregnant with twins or triplets. °· Being 35 or older. °· Being African-American. °· Having kidney disease or diabetes. °· Having medical conditions such as lupus or blood diseases. °· Being very overweight (obese). ° °What are the signs or symptoms? °The earliest signs of preeclampsia are: °· High blood pressure. °· Increased protein in your urine. Your health care provider will check for this at every visit before you give birth (prenatal visit). ° °Other symptoms that may develop as the condition gets worse include: °· Severe headaches. °· Sudden weight gain. °· Swelling of the hands, face, legs, and feet. °· Nausea and vomiting. °· Vision problems, such as blurred or double vision. °· Numbness in the face, arms, legs, and feet. °· Urinating less than usual. °· Dizziness. °· Slurred speech. °· Abdominal pain,  especially upper abdominal pain. °· Convulsions or seizures. ° °Symptoms generally go away after giving birth. °How is this diagnosed? °There are no screening tests for preeclampsia. Your health care provider will ask you about symptoms and check for signs of preeclampsia during your prenatal visits. You may also have tests that include: °· Urine tests. °· Blood tests. °· Checking your blood pressure. °· Monitoring your baby’s heart rate. °· Ultrasound. ° °How is this treated? °You and your health care provider will determine the treatment approach that is best for you. Treatment may include: °· Having more frequent prenatal exams to check for signs of preeclampsia, if you have an increased risk for preeclampsia. °· Bed rest. °· Reducing how much salt (sodium) you eat. °· Medicine to lower your blood pressure. °· Staying in the hospital, if your condition is severe. There, treatment will focus on controlling your blood pressure and the amount of fluids in your body (fluid retention). °· You may need to take medicine (magnesium sulfate) to prevent seizures. This medicine may be given as an injection or through an IV tube. °· Delivering your baby early, if your condition gets worse. You may have your labor started with medicine (induced), or you may have a cesarean delivery. ° °Follow these instructions at home: °Eating and drinking ° °· Drink enough fluid to keep your urine clear or pale yellow. °· Eat a healthy diet that is low in sodium. Do not add salt to your food. Check nutrition labels to see how much sodium a food or beverage contains. °· Avoid caffeine. °Lifestyle °· Do not use any products that contain nicotine or tobacco, such as cigarettes   and e-cigarettes. If you need help quitting, ask your health care provider. °· Do not use alcohol or drugs. °· Avoid stress as much as possible. Rest and get plenty of sleep. °General instructions °· Take over-the-counter and prescription medicines only as told by your  health care provider. °· When lying down, lie on your side. This keeps pressure off of your baby. °· When sitting or lying down, raise (elevate) your feet. Try putting some pillows underneath your lower legs. °· Exercise regularly. Ask your health care provider what kinds of exercise are best for you. °· Keep all follow-up and prenatal visits as told by your health care provider. This is important. °How is this prevented? °To prevent preeclampsia or eclampsia from developing during another pregnancy: °· Get proper medical care during pregnancy. Your health care provider may be able to prevent preeclampsia or diagnose and treat it early. °· Your health care provider may have you take a low-dose aspirin or a calcium supplement during your next pregnancy. °· You may have tests of your blood pressure and kidney function after giving birth. °· Maintain a healthy weight. Ask your health care provider for help managing weight gain during pregnancy. °· Work with your health care provider to manage any long-term (chronic) health conditions you have, such as diabetes or kidney problems. ° °Contact a health care provider if: °· You gain more weight than expected. °· You have headaches. °· You have nausea or vomiting. °· You have abdominal pain. °· You feel dizzy or light-headed. °Get help right away if: °· You develop sudden or severe swelling anywhere in your body. This usually happens in the legs. °· You gain 5 lbs (2.3 kg) or more during one week. °· You have severe: °? Abdominal pain. °? Headaches. °? Dizziness. °? Vision problems. °? Confusion. °? Nausea or vomiting. °· You have a seizure. °· You have trouble moving any part of your body. °· You develop numbness in any part of your body. °· You have trouble speaking. °· You have any abnormal bleeding. °· You pass out. °This information is not intended to replace advice given to you by your health care provider. Make sure you discuss any questions you have with your health  care provider. °Document Released: 05/29/2000 Document Revised: 01/28/2016 Document Reviewed: 01/06/2016 °Elsevier Interactive Patient Education © 2018 Elsevier Inc. ° °

## 2018-04-27 NOTE — Progress Notes (Signed)
ROB- pt is having some swelling has noticed her lips are swollen, denies headaches,pt is having pelvic pressure

## 2018-04-27 NOTE — OB Triage Note (Signed)
L&D OB Triage Note  SUBJECTIVE Natalie EvensJacklyn Ray is a 37 y.o. G1P0 female at 4331w0d, EDD Estimated Date of Delivery: 05/18/18 who presented to triage from the office for pre eclampsia evaluation.   OB History  Gravida Para Term Preterm AB Living  1 0 0 0 0 0  SAB TAB Ectopic Multiple Live Births  0 0 0 0 0    # Outcome Date GA Lbr Len/2nd Weight Sex Delivery Anes PTL Lv  1 Current             Medications Prior to Admission  Medication Sig Dispense Refill Last Dose  . pantoprazole (PROTONIX) 40 MG tablet Take 1 tablet (40 mg total) by mouth daily. 30 tablet 5 04/26/2018 at Unknown time  . Prenatal-DSS-FeCb-FeGl-FA (CITRANATAL BLOOM) 90-1 MG TABS Take 1 tablet by mouth daily. 30 tablet 6 04/26/2018 at Unknown time     OBJECTIVE  Nursing Evaluation:   BP 129/73   Pulse 80   Temp 98.1 F (36.7 C) (Oral)   Resp 16   Ht 5\' 8"  (1.727 m)   Wt 104.7 kg   LMP  (LMP Unknown)   BMI 35.11 kg/m    Findings:   Reactive NST  NST was performed and has been reviewed by me. Consulted Dr. Logan BoresEvans on strip.   NST INTERPRETATION: Category I  Mode: External Baseline Rate (A): 140 bpm Variability: Moderate Accelerations: 15 x 15 Decelerations: None     Contraction Frequency (min): ocassional w UI  ASSESSMENT Impression:  1.  Pregnancy:  G1P0 at 7531w0d , EDD Estimated Date of Delivery: 05/18/18 2.  NST:  Category I 3. Reflexes 1+ bilaterally, negative clonus, denies headache and epigastric pain.  CBC    Component Value Date/Time   WBC 11.5 (H) 04/27/2018 1154   RBC 3.79 (L) 04/27/2018 1154   HGB 11.2 (L) 04/27/2018 1154   HGB 12.0 04/19/2018 1207   HCT 33.4 (L) 04/27/2018 1154   HCT 35.2 04/19/2018 1207   PLT 194 04/27/2018 1154   PLT 192 04/19/2018 1207   MCV 88.1 04/27/2018 1154   MCV 88 04/19/2018 1207   MCH 29.6 04/27/2018 1154   MCHC 33.5 04/27/2018 1154   RDW 13.7 04/27/2018 1154   RDW 13.4 04/19/2018 1207   LYMPHSABS 2.2 04/19/2018 1207   MONOABS 0.5 12/17/2016 0947   EOSABS 0.1 04/19/2018 1207   BASOSABS 0.1 04/19/2018 1207   CMP Latest Ref Rng & Units 04/27/2018 04/19/2018 12/17/2016  Glucose 70 - 99 mg/dL 84 84 97  BUN 6 - 20 mg/dL 5(L) 5(L) 11  Creatinine 0.44 - 1.00 mg/dL 1.610.57 0.960.61 0.450.83  Sodium 135 - 145 mmol/L 136 141 138  Potassium 3.5 - 5.1 mmol/L 3.6 3.8 3.8  Chloride 98 - 111 mmol/L 104 104 102  CO2 22 - 32 mmol/L 23 21 30   Calcium 8.9 - 10.3 mg/dL 9.1 9.2 9.5  Total Protein 6.5 - 8.1 g/dL 6.1(L) 5.6(L) 7.3  Total Bilirubin 0.3 - 1.2 mg/dL 0.6 0.3 0.3  Alkaline Phos 38 - 126 U/L 156(H) 154(H) 55  AST 15 - 41 U/L 20 12 13   ALT 0 - 44 U/L 15 12 15    Creatinine, Urine mg/dL 50   Total Protein, Urine mg/dL 10   Comment: NO NORMAL RANGE ESTABLISHED FOR THIS TEST  Protein Creatinine Ratio 0.00 - 0.15 mg/mg 0.20High      PLAN 1. Reassurance given, reviewed S & S of Pre Eclampsia .  2. Discharge home with standard labor precautions given to  return to L&D or call the office for problems. 3. Continue routine prenatal care as scheduled    I was present and evaluated the patient in person.   [name], CNM

## 2018-04-29 ENCOUNTER — Ambulatory Visit (INDEPENDENT_AMBULATORY_CARE_PROVIDER_SITE_OTHER): Payer: 59 | Admitting: Certified Nurse Midwife

## 2018-04-29 DIAGNOSIS — Z3403 Encounter for supervision of normal first pregnancy, third trimester: Secondary | ICD-10-CM

## 2018-04-29 NOTE — Patient Instructions (Signed)
Preeclampsia and Eclampsia °Preeclampsia is a serious condition that develops only during pregnancy. It is also called toxemia of pregnancy. This condition causes high blood pressure along with other symptoms, such as swelling and headaches. These symptoms may develop as the condition gets worse. Preeclampsia may occur at 20 weeks of pregnancy or later. °Diagnosing and treating preeclampsia early is very important. If not treated early, it can cause serious problems for you and your baby. One problem it can lead to is eclampsia, which is a condition that causes muscle jerking or shaking (convulsions or seizures) in the mother. Delivering your baby is the best treatment for preeclampsia or eclampsia. Preeclampsia and eclampsia symptoms usually go away after your baby is born. °What are the causes? °The cause of preeclampsia is not known. °What increases the risk? °The following risk factors make you more likely to develop preeclampsia: °· Being pregnant for the first time. °· Having had preeclampsia during a past pregnancy. °· Having a family history of preeclampsia. °· Having high blood pressure. °· Being pregnant with twins or triplets. °· Being 35 or older. °· Being African-American. °· Having kidney disease or diabetes. °· Having medical conditions such as lupus or blood diseases. °· Being very overweight (obese). ° °What are the signs or symptoms? °The earliest signs of preeclampsia are: °· High blood pressure. °· Increased protein in your urine. Your health care provider will check for this at every visit before you give birth (prenatal visit). ° °Other symptoms that may develop as the condition gets worse include: °· Severe headaches. °· Sudden weight gain. °· Swelling of the hands, face, legs, and feet. °· Nausea and vomiting. °· Vision problems, such as blurred or double vision. °· Numbness in the face, arms, legs, and feet. °· Urinating less than usual. °· Dizziness. °· Slurred speech. °· Abdominal pain,  especially upper abdominal pain. °· Convulsions or seizures. ° °Symptoms generally go away after giving birth. °How is this diagnosed? °There are no screening tests for preeclampsia. Your health care provider will ask you about symptoms and check for signs of preeclampsia during your prenatal visits. You may also have tests that include: °· Urine tests. °· Blood tests. °· Checking your blood pressure. °· Monitoring your baby’s heart rate. °· Ultrasound. ° °How is this treated? °You and your health care provider will determine the treatment approach that is best for you. Treatment may include: °· Having more frequent prenatal exams to check for signs of preeclampsia, if you have an increased risk for preeclampsia. °· Bed rest. °· Reducing how much salt (sodium) you eat. °· Medicine to lower your blood pressure. °· Staying in the hospital, if your condition is severe. There, treatment will focus on controlling your blood pressure and the amount of fluids in your body (fluid retention). °· You may need to take medicine (magnesium sulfate) to prevent seizures. This medicine may be given as an injection or through an IV tube. °· Delivering your baby early, if your condition gets worse. You may have your labor started with medicine (induced), or you may have a cesarean delivery. ° °Follow these instructions at home: °Eating and drinking ° °· Drink enough fluid to keep your urine clear or pale yellow. °· Eat a healthy diet that is low in sodium. Do not add salt to your food. Check nutrition labels to see how much sodium a food or beverage contains. °· Avoid caffeine. °Lifestyle °· Do not use any products that contain nicotine or tobacco, such as cigarettes   and e-cigarettes. If you need help quitting, ask your health care provider. °· Do not use alcohol or drugs. °· Avoid stress as much as possible. Rest and get plenty of sleep. °General instructions °· Take over-the-counter and prescription medicines only as told by your  health care provider. °· When lying down, lie on your side. This keeps pressure off of your baby. °· When sitting or lying down, raise (elevate) your feet. Try putting some pillows underneath your lower legs. °· Exercise regularly. Ask your health care provider what kinds of exercise are best for you. °· Keep all follow-up and prenatal visits as told by your health care provider. This is important. °How is this prevented? °To prevent preeclampsia or eclampsia from developing during another pregnancy: °· Get proper medical care during pregnancy. Your health care provider may be able to prevent preeclampsia or diagnose and treat it early. °· Your health care provider may have you take a low-dose aspirin or a calcium supplement during your next pregnancy. °· You may have tests of your blood pressure and kidney function after giving birth. °· Maintain a healthy weight. Ask your health care provider for help managing weight gain during pregnancy. °· Work with your health care provider to manage any long-term (chronic) health conditions you have, such as diabetes or kidney problems. ° °Contact a health care provider if: °· You gain more weight than expected. °· You have headaches. °· You have nausea or vomiting. °· You have abdominal pain. °· You feel dizzy or light-headed. °Get help right away if: °· You develop sudden or severe swelling anywhere in your body. This usually happens in the legs. °· You gain 5 lbs (2.3 kg) or more during one week. °· You have severe: °? Abdominal pain. °? Headaches. °? Dizziness. °? Vision problems. °? Confusion. °? Nausea or vomiting. °· You have a seizure. °· You have trouble moving any part of your body. °· You develop numbness in any part of your body. °· You have trouble speaking. °· You have any abnormal bleeding. °· You pass out. °This information is not intended to replace advice given to you by your health care provider. Make sure you discuss any questions you have with your health  care provider. °Document Released: 05/29/2000 Document Revised: 01/28/2016 Document Reviewed: 01/06/2016 °Elsevier Interactive Patient Education © 2018 Elsevier Inc. ° °

## 2018-04-29 NOTE — Progress Notes (Signed)
Pt present for BP checks she states she had headache yesterday( that is no longer present) and elevated Bps at home. NST  Reactive  FHT-135 Moderate variability Accelerations present Decelerations absent  Irritability present.   Serial Bps  initial BP 135/95  Repeat 132/79               129/80               139/74               132/69 She is to follow up as scheduled on Monday   Doreene BurkeAnnie Irlene Crudup, CNM

## 2018-05-02 ENCOUNTER — Ambulatory Visit (INDEPENDENT_AMBULATORY_CARE_PROVIDER_SITE_OTHER): Payer: 59 | Admitting: Certified Nurse Midwife

## 2018-05-02 ENCOUNTER — Encounter: Payer: Self-pay | Admitting: Certified Nurse Midwife

## 2018-05-02 ENCOUNTER — Other Ambulatory Visit: Payer: 59

## 2018-05-02 VITALS — BP 120/88 | HR 95 | Wt 224.3 lb

## 2018-05-02 DIAGNOSIS — O133 Gestational [pregnancy-induced] hypertension without significant proteinuria, third trimester: Secondary | ICD-10-CM

## 2018-05-02 DIAGNOSIS — Z3A38 38 weeks gestation of pregnancy: Secondary | ICD-10-CM

## 2018-05-02 LAB — POCT URINALYSIS DIPSTICK OB
Bilirubin, UA: NEGATIVE
Blood, UA: NEGATIVE
GLUCOSE, UA: NEGATIVE
Ketones, UA: NEGATIVE
LEUKOCYTES UA: NEGATIVE
NITRITE UA: NEGATIVE
SPEC GRAV UA: 1.025 (ref 1.010–1.025)
Urobilinogen, UA: 0.2 E.U./dL
pH, UA: 5 (ref 5.0–8.0)

## 2018-05-02 NOTE — Progress Notes (Signed)
ROB doing well. Feels good movement.   NST for gestational hypertension FHT: 130 Accelerations present Decelerations absent Moderate variability  No decelerations Contractions: irregular  Thursday or Friday plan for BPP/growth/afi. Herbal prep handout. Discussed induction @ 39 wks Herbal prep handout . SVE 1cm/60/ballatiobale. Discussed having SVE again at next appointment to help release of natural prostaglandins to help soften and dilate cervix.   Pre e precautions reviewed.

## 2018-05-02 NOTE — Patient Instructions (Signed)
Preeclampsia and Eclampsia °Preeclampsia is a serious condition that develops only during pregnancy. It is also called toxemia of pregnancy. This condition causes high blood pressure along with other symptoms, such as swelling and headaches. These symptoms may develop as the condition gets worse. Preeclampsia may occur at 20 weeks of pregnancy or later. °Diagnosing and treating preeclampsia early is very important. If not treated early, it can cause serious problems for you and your baby. One problem it can lead to is eclampsia, which is a condition that causes muscle jerking or shaking (convulsions or seizures) in the mother. Delivering your baby is the best treatment for preeclampsia or eclampsia. Preeclampsia and eclampsia symptoms usually go away after your baby is born. °What are the causes? °The cause of preeclampsia is not known. °What increases the risk? °The following risk factors make you more likely to develop preeclampsia: °· Being pregnant for the first time. °· Having had preeclampsia during a past pregnancy. °· Having a family history of preeclampsia. °· Having high blood pressure. °· Being pregnant with twins or triplets. °· Being 35 or older. °· Being African-American. °· Having kidney disease or diabetes. °· Having medical conditions such as lupus or blood diseases. °· Being very overweight (obese). ° °What are the signs or symptoms? °The earliest signs of preeclampsia are: °· High blood pressure. °· Increased protein in your urine. Your health care provider will check for this at every visit before you give birth (prenatal visit). ° °Other symptoms that may develop as the condition gets worse include: °· Severe headaches. °· Sudden weight gain. °· Swelling of the hands, face, legs, and feet. °· Nausea and vomiting. °· Vision problems, such as blurred or double vision. °· Numbness in the face, arms, legs, and feet. °· Urinating less than usual. °· Dizziness. °· Slurred speech. °· Abdominal pain,  especially upper abdominal pain. °· Convulsions or seizures. ° °Symptoms generally go away after giving birth. °How is this diagnosed? °There are no screening tests for preeclampsia. Your health care provider will ask you about symptoms and check for signs of preeclampsia during your prenatal visits. You may also have tests that include: °· Urine tests. °· Blood tests. °· Checking your blood pressure. °· Monitoring your baby’s heart rate. °· Ultrasound. ° °How is this treated? °You and your health care provider will determine the treatment approach that is best for you. Treatment may include: °· Having more frequent prenatal exams to check for signs of preeclampsia, if you have an increased risk for preeclampsia. °· Bed rest. °· Reducing how much salt (sodium) you eat. °· Medicine to lower your blood pressure. °· Staying in the hospital, if your condition is severe. There, treatment will focus on controlling your blood pressure and the amount of fluids in your body (fluid retention). °· You may need to take medicine (magnesium sulfate) to prevent seizures. This medicine may be given as an injection or through an IV tube. °· Delivering your baby early, if your condition gets worse. You may have your labor started with medicine (induced), or you may have a cesarean delivery. ° °Follow these instructions at home: °Eating and drinking ° °· Drink enough fluid to keep your urine clear or pale yellow. °· Eat a healthy diet that is low in sodium. Do not add salt to your food. Check nutrition labels to see how much sodium a food or beverage contains. °· Avoid caffeine. °Lifestyle °· Do not use any products that contain nicotine or tobacco, such as cigarettes   and e-cigarettes. If you need help quitting, ask your health care provider. °· Do not use alcohol or drugs. °· Avoid stress as much as possible. Rest and get plenty of sleep. °General instructions °· Take over-the-counter and prescription medicines only as told by your  health care provider. °· When lying down, lie on your side. This keeps pressure off of your baby. °· When sitting or lying down, raise (elevate) your feet. Try putting some pillows underneath your lower legs. °· Exercise regularly. Ask your health care provider what kinds of exercise are best for you. °· Keep all follow-up and prenatal visits as told by your health care provider. This is important. °How is this prevented? °To prevent preeclampsia or eclampsia from developing during another pregnancy: °· Get proper medical care during pregnancy. Your health care provider may be able to prevent preeclampsia or diagnose and treat it early. °· Your health care provider may have you take a low-dose aspirin or a calcium supplement during your next pregnancy. °· You may have tests of your blood pressure and kidney function after giving birth. °· Maintain a healthy weight. Ask your health care provider for help managing weight gain during pregnancy. °· Work with your health care provider to manage any long-term (chronic) health conditions you have, such as diabetes or kidney problems. ° °Contact a health care provider if: °· You gain more weight than expected. °· You have headaches. °· You have nausea or vomiting. °· You have abdominal pain. °· You feel dizzy or light-headed. °Get help right away if: °· You develop sudden or severe swelling anywhere in your body. This usually happens in the legs. °· You gain 5 lbs (2.3 kg) or more during one week. °· You have severe: °? Abdominal pain. °? Headaches. °? Dizziness. °? Vision problems. °? Confusion. °? Nausea or vomiting. °· You have a seizure. °· You have trouble moving any part of your body. °· You develop numbness in any part of your body. °· You have trouble speaking. °· You have any abnormal bleeding. °· You pass out. °This information is not intended to replace advice given to you by your health care provider. Make sure you discuss any questions you have with your health  care provider. °Document Released: 05/29/2000 Document Revised: 01/28/2016 Document Reviewed: 01/06/2016 °Elsevier Interactive Patient Education © 2018 Elsevier Inc. ° °

## 2018-05-05 ENCOUNTER — Ambulatory Visit (INDEPENDENT_AMBULATORY_CARE_PROVIDER_SITE_OTHER): Payer: 59 | Admitting: Obstetrics and Gynecology

## 2018-05-05 ENCOUNTER — Ambulatory Visit (INDEPENDENT_AMBULATORY_CARE_PROVIDER_SITE_OTHER): Payer: 59

## 2018-05-05 ENCOUNTER — Other Ambulatory Visit: Payer: Self-pay | Admitting: Obstetrics and Gynecology

## 2018-05-05 VITALS — BP 140/87 | HR 84 | Wt 225.7 lb

## 2018-05-05 DIAGNOSIS — Z3A38 38 weeks gestation of pregnancy: Secondary | ICD-10-CM | POA: Diagnosis not present

## 2018-05-05 DIAGNOSIS — O99213 Obesity complicating pregnancy, third trimester: Secondary | ICD-10-CM

## 2018-05-05 DIAGNOSIS — Z3493 Encounter for supervision of normal pregnancy, unspecified, third trimester: Secondary | ICD-10-CM

## 2018-05-05 LAB — POCT URINALYSIS DIPSTICK OB
Bilirubin, UA: NEGATIVE
Blood, UA: NEGATIVE
Glucose, UA: NEGATIVE
Ketones, UA: NEGATIVE
LEUKOCYTES UA: NEGATIVE
NITRITE UA: NEGATIVE
PH UA: 6 (ref 5.0–8.0)
Spec Grav, UA: 1.02 (ref 1.010–1.025)
UROBILINOGEN UA: 0.2 U/dL

## 2018-05-05 NOTE — Progress Notes (Signed)
ROB and BPP- Reviewed ultrasound findings below:  Findings:  Singleton intrauterine pregnancy is visualized with FHR at 131 BPM.    Fetal presentation is Cephalic.  Placenta: anterior. Grade: 2 AFI: 10.1 cm  BPP Scoring: Movement: 2/2  Tone: 2/2  Breathing: 2/2  AFI: 2/2  Impression: 1. 6632w1d Viable Singleton Intrauterine pregnancy dated by previously established criteria. 2. AFI is 10.1 cm.  3. BPP is 8/8  Will return tues morning for foley bulb placement.

## 2018-05-05 NOTE — Progress Notes (Signed)
ROB- pt is having some pelvic pressure, lots of mucous

## 2018-05-06 ENCOUNTER — Telehealth: Payer: Self-pay | Admitting: Obstetrics and Gynecology

## 2018-05-06 ENCOUNTER — Encounter: Payer: 59 | Admitting: Certified Nurse Midwife

## 2018-05-06 NOTE — Telephone Encounter (Signed)
The patient called and stated her BP, sitting with feet on the floor, was 150/95 and she said she relaxed in the recliner with her feet up for 30 mins prior to taking her BP.  She stated she could feel her pulse in her head, but no headache.  Upon speaking with her provider, Melody, she stated she [facility], and not come here due to the patient needing labs and monitoring that could not be done in a time effective manner at the office at 2:48 PM, and she preferred she go there to be sure the patient is taking care of properly.  NO FURTHER ACTION NEEDED AT THIS TIME.

## 2018-05-10 ENCOUNTER — Ambulatory Visit (INDEPENDENT_AMBULATORY_CARE_PROVIDER_SITE_OTHER): Payer: 59 | Admitting: Certified Nurse Midwife

## 2018-05-10 ENCOUNTER — Encounter: Payer: Self-pay | Admitting: Certified Nurse Midwife

## 2018-05-10 ENCOUNTER — Encounter: Payer: 59 | Admitting: Obstetrics and Gynecology

## 2018-05-10 VITALS — BP 127/82 | HR 94 | Wt 226.5 lb

## 2018-05-10 DIAGNOSIS — O99214 Obesity complicating childbirth: Secondary | ICD-10-CM | POA: Diagnosis present

## 2018-05-10 DIAGNOSIS — E668 Other obesity: Secondary | ICD-10-CM | POA: Diagnosis present

## 2018-05-10 DIAGNOSIS — O134 Gestational [pregnancy-induced] hypertension without significant proteinuria, complicating childbirth: Principal | ICD-10-CM | POA: Diagnosis present

## 2018-05-10 DIAGNOSIS — Z3493 Encounter for supervision of normal pregnancy, unspecified, third trimester: Secondary | ICD-10-CM | POA: Diagnosis not present

## 2018-05-10 DIAGNOSIS — Z3A39 39 weeks gestation of pregnancy: Secondary | ICD-10-CM

## 2018-05-10 DIAGNOSIS — Z87891 Personal history of nicotine dependence: Secondary | ICD-10-CM

## 2018-05-10 LAB — POCT URINALYSIS DIPSTICK OB
Bilirubin, UA: NEGATIVE
Glucose, UA: NEGATIVE
Ketones, UA: NEGATIVE
LEUKOCYTES UA: NEGATIVE
NITRITE UA: NEGATIVE
PH UA: 6 (ref 5.0–8.0)
RBC UA: NEGATIVE
SPEC GRAV UA: 1.025 (ref 1.010–1.025)
UROBILINOGEN UA: 0.2 U/dL

## 2018-05-10 NOTE — Progress Notes (Signed)
FOLEY BULB PROCEDURE NOTE:    I have verified that the patient is an appropriate candidate for outpatient foley bulb placement.  She has consented to foley bulb placement today. She has no concerns at this time. A pre-procedure NST performed today was reviewed and was found to be reactive.     The patient was placed in the dorsal lithotomy position.  A speculum was inserted into the vagina and the cervix was visualized and A vaginal examination was performed and a finger was placed at the base of the cervical os. Exam 1 cm/70/-2.  A foley catheter was advanced into the cervix slightly pass the internal cervical os.  The foley balloon was filled with 35cc of normal saline.  Gentle traction was placed on the foley bulb, and the catheter was taped to the medial portion of the thigh.  The patient tolerated the procedure well.  A post-procedure NST was performed. Sterile technique was observed throughout.      NONSTRESS TEST INTERPRETATION  INDICATIONS: Foley bulb induction placement  FHR baseline: 140 bpm (pre-procedure) and 130  bpm (post procedure) RESULTS:Reactive x 2.  COMMENTS: irregular mild contractions   PLAN: 1. To arrive at Labor and Delivery for scheduled induction of labor on 05/11/18@ 0000.  She was given post-procedure instructions.    Doreene BurkeAnnie Holly Pring, CNM

## 2018-05-10 NOTE — Patient Instructions (Signed)

## 2018-05-11 ENCOUNTER — Inpatient Hospital Stay: Payer: 59 | Admitting: Anesthesiology

## 2018-05-11 ENCOUNTER — Other Ambulatory Visit: Payer: Self-pay

## 2018-05-11 ENCOUNTER — Inpatient Hospital Stay
Admission: EM | Admit: 2018-05-11 | Discharge: 2018-05-13 | DRG: 807 | Disposition: A | Discharge status: Home / Self Care | Payer: 59 | Attending: Certified Nurse Midwife | Admitting: Certified Nurse Midwife

## 2018-05-11 DIAGNOSIS — E668 Other obesity: Secondary | ICD-10-CM | POA: Diagnosis present

## 2018-05-11 DIAGNOSIS — Z87891 Personal history of nicotine dependence: Secondary | ICD-10-CM | POA: Diagnosis not present

## 2018-05-11 DIAGNOSIS — O99214 Obesity complicating childbirth: Secondary | ICD-10-CM | POA: Diagnosis present

## 2018-05-11 DIAGNOSIS — O134 Gestational [pregnancy-induced] hypertension without significant proteinuria, complicating childbirth: Secondary | ICD-10-CM | POA: Diagnosis not present

## 2018-05-11 DIAGNOSIS — Z3A39 39 weeks gestation of pregnancy: Secondary | ICD-10-CM | POA: Diagnosis not present

## 2018-05-11 LAB — COMPREHENSIVE METABOLIC PANEL
ALT: 17 U/L (ref 0–44)
AST: 26 U/L (ref 15–41)
Albumin: 2.9 g/dL — ABNORMAL LOW (ref 3.5–5.0)
Alkaline Phosphatase: 201 U/L — ABNORMAL HIGH (ref 38–126)
Anion gap: 9 (ref 5–15)
BILIRUBIN TOTAL: 0.5 mg/dL (ref 0.3–1.2)
BUN: 7 mg/dL (ref 6–20)
CO2: 22 mmol/L (ref 22–32)
Calcium: 9.2 mg/dL (ref 8.9–10.3)
Chloride: 105 mmol/L (ref 98–111)
Creatinine, Ser: 0.56 mg/dL (ref 0.44–1.00)
Glucose, Bld: 92 mg/dL (ref 70–99)
Potassium: 4 mmol/L (ref 3.5–5.1)
Sodium: 136 mmol/L (ref 135–145)
TOTAL PROTEIN: 6.6 g/dL (ref 6.5–8.1)

## 2018-05-11 LAB — CBC
HEMATOCRIT: 35.4 % — AB (ref 36.0–46.0)
Hemoglobin: 11.8 g/dL — ABNORMAL LOW (ref 12.0–15.0)
MCH: 29.1 pg (ref 26.0–34.0)
MCHC: 33.3 g/dL (ref 30.0–36.0)
MCV: 87.2 fL (ref 80.0–100.0)
NRBC: 0 % (ref 0.0–0.2)
Platelets: 176 10*3/uL (ref 150–400)
RBC: 4.06 MIL/uL (ref 3.87–5.11)
RDW: 13.8 % (ref 11.5–15.5)
WBC: 11.4 10*3/uL — ABNORMAL HIGH (ref 4.0–10.5)

## 2018-05-11 LAB — PROTEIN / CREATININE RATIO, URINE
CREATININE, URINE: 87 mg/dL
Protein Creatinine Ratio: 0.24 mg/mg{Cre} — ABNORMAL HIGH (ref 0.00–0.15)
Total Protein, Urine: 21 mg/dL

## 2018-05-11 LAB — TYPE AND SCREEN
ABO/RH(D): A POS
ANTIBODY SCREEN: NEGATIVE

## 2018-05-11 MED ORDER — EPHEDRINE 5 MG/ML INJ
10.0000 mg | INTRAVENOUS | Status: DC | PRN
  Filled 2018-05-11: qty 2

## 2018-05-11 MED ORDER — BUTORPHANOL TARTRATE 2 MG/ML IJ SOLN
1.0000 mg | INTRAMUSCULAR | Status: DC | PRN
  Administered 2018-05-11 (×2): 1 mg via INTRAVENOUS
  Filled 2018-05-11 (×3): qty 1

## 2018-05-11 MED ORDER — PHENYLEPHRINE 40 MCG/ML (10ML) SYRINGE FOR IV PUSH (FOR BLOOD PRESSURE SUPPORT)
80.0000 ug | PREFILLED_SYRINGE | INTRAVENOUS | Status: DC | PRN
  Filled 2018-05-11: qty 10
  Filled 2018-05-11: qty 5

## 2018-05-11 MED ORDER — ACETAMINOPHEN 500 MG PO TABS
1000.0000 mg | ORAL_TABLET | Freq: Once | ORAL | Status: AC | PRN
  Administered 2018-05-11: 1000 mg via ORAL

## 2018-05-11 MED ORDER — COCONUT OIL OIL
1.0000 "application " | TOPICAL_OIL | Status: DC | PRN
  Filled 2018-05-11: qty 120

## 2018-05-11 MED ORDER — OXYTOCIN 10 UNIT/ML IJ SOLN
INTRAMUSCULAR | Status: AC
  Filled 2018-05-11: qty 2

## 2018-05-11 MED ORDER — LIDOCAINE HCL (PF) 1 % IJ SOLN
INTRAMUSCULAR | Status: AC
  Filled 2018-05-11: qty 30

## 2018-05-11 MED ORDER — FENTANYL 2.5 MCG/ML W/ROPIVACAINE 0.15% IN NS 100 ML EPIDURAL (ARMC)
12.0000 mL/h | EPIDURAL | Status: DC
  Administered 2018-05-11: 12 mL/h via EPIDURAL

## 2018-05-11 MED ORDER — BENZOCAINE-MENTHOL 20-0.5 % EX AERO
INHALATION_SPRAY | CUTANEOUS | Status: AC
  Filled 2018-05-11: qty 56

## 2018-05-11 MED ORDER — AMMONIA AROMATIC IN INHA
RESPIRATORY_TRACT | Status: AC
  Filled 2018-05-11: qty 10

## 2018-05-11 MED ORDER — PHENYLEPHRINE 40 MCG/ML (10ML) SYRINGE FOR IV PUSH (FOR BLOOD PRESSURE SUPPORT)
120.0000 ug | PREFILLED_SYRINGE | Freq: Once | INTRAVENOUS | Status: AC
  Administered 2018-05-11: 120 ug via INTRAVENOUS

## 2018-05-11 MED ORDER — OXYCODONE-ACETAMINOPHEN 5-325 MG PO TABS: 2.0000 | ORAL_TABLET | ORAL | Status: DC | PRN

## 2018-05-11 MED ORDER — OXYTOCIN 40 UNITS IN LACTATED RINGERS INFUSION - SIMPLE MED: 1.0000 m[IU]/min | INTRAVENOUS | Status: DC

## 2018-05-11 MED ORDER — PRENATAL MULTIVITAMIN CH
1.0000 | ORAL_TABLET | Freq: Every day | ORAL | Status: DC
  Administered 2018-05-12 – 2018-05-13 (×2): 1 via ORAL
  Filled 2018-05-11 (×2): qty 1

## 2018-05-11 MED ORDER — LACTATED RINGERS IV SOLN
500.0000 mL | Freq: Once | INTRAVENOUS | Status: AC
  Administered 2018-05-11: 500 mL via INTRAVENOUS

## 2018-05-11 MED ORDER — OXYTOCIN 40 UNITS IN LACTATED RINGERS INFUSION - SIMPLE MED
2.5000 [IU]/h | INTRAVENOUS | Status: DC
  Filled 2018-05-11: qty 1000

## 2018-05-11 MED ORDER — LIDOCAINE-EPINEPHRINE (PF) 1.5 %-1:200000 IJ SOLN
INTRAMUSCULAR | Status: DC | PRN
  Administered 2018-05-11: 3 mL via EPIDURAL

## 2018-05-11 MED ORDER — SENNOSIDES-DOCUSATE SODIUM 8.6-50 MG PO TABS
2.0000 | ORAL_TABLET | ORAL | Status: DC
  Administered 2018-05-11: 2 via ORAL
  Filled 2018-05-11: qty 2

## 2018-05-11 MED ORDER — DOCUSATE SODIUM 100 MG PO CAPS
100.0000 mg | ORAL_CAPSULE | Freq: Two times a day (BID) | ORAL | Status: DC
  Administered 2018-05-11 – 2018-05-13 (×4): 100 mg via ORAL
  Filled 2018-05-11 (×4): qty 1

## 2018-05-11 MED ORDER — OXYTOCIN BOLUS FROM INFUSION
500.0000 mL | Freq: Once | INTRAVENOUS | Status: AC
  Administered 2018-05-11: 500 mL via INTRAVENOUS

## 2018-05-11 MED ORDER — SIMETHICONE 80 MG PO CHEW: 80.0000 mg | CHEWABLE_TABLET | ORAL | Status: DC | PRN

## 2018-05-11 MED ORDER — FENTANYL 2.5 MCG/ML W/ROPIVACAINE 0.15% IN NS 100 ML EPIDURAL (ARMC)
EPIDURAL | Status: AC
  Filled 2018-05-11: qty 100

## 2018-05-11 MED ORDER — OXYCODONE-ACETAMINOPHEN 5-325 MG PO TABS: 1.0000 | ORAL_TABLET | ORAL | Status: DC | PRN

## 2018-05-11 MED ORDER — BUPIVACAINE HCL (PF) 0.25 % IJ SOLN
INTRAMUSCULAR | Status: DC | PRN
  Administered 2018-05-11 (×2): 4 mL via EPIDURAL
  Administered 2018-05-11: 5 mL via EPIDURAL

## 2018-05-11 MED ORDER — TERBUTALINE SULFATE 1 MG/ML IJ SOLN: 0.2500 mg | Freq: Once | INTRAMUSCULAR | Status: DC | PRN

## 2018-05-11 MED ORDER — WITCH HAZEL-GLYCERIN EX PADS: 1.0000 "application " | MEDICATED_PAD | CUTANEOUS | Status: DC | PRN

## 2018-05-11 MED ORDER — ONDANSETRON HCL 4 MG PO TABS: 4.0000 mg | ORAL_TABLET | ORAL | Status: DC | PRN

## 2018-05-11 MED ORDER — FENTANYL 2.5 MCG/ML W/ROPIVACAINE 0.15% IN NS 100 ML EPIDURAL (ARMC)
EPIDURAL | Status: DC | PRN
  Administered 2018-05-11: 12 mL/h via EPIDURAL

## 2018-05-11 MED ORDER — ACETAMINOPHEN 500 MG PO TABS
ORAL_TABLET | ORAL | Status: AC
  Filled 2018-05-11: qty 2

## 2018-05-11 MED ORDER — PHENYLEPHRINE 40 MCG/ML (10ML) SYRINGE FOR IV PUSH (FOR BLOOD PRESSURE SUPPORT)
80.0000 ug | PREFILLED_SYRINGE | INTRAVENOUS | Status: DC | PRN
  Filled 2018-05-11: qty 5

## 2018-05-11 MED ORDER — MISOPROSTOL 50MCG HALF TABLET
50.0000 ug | ORAL_TABLET | ORAL | Status: DC | PRN
  Administered 2018-05-11: 50 ug via VAGINAL
  Filled 2018-05-11: qty 1

## 2018-05-11 MED ORDER — LACTATED RINGERS IV SOLN
INTRAVENOUS | Status: DC
  Administered 2018-05-11 (×5): via INTRAVENOUS

## 2018-05-11 MED ORDER — DIPHENHYDRAMINE HCL 50 MG/ML IJ SOLN: 12.5000 mg | INTRAMUSCULAR | Status: DC | PRN

## 2018-05-11 MED ORDER — FERROUS SULFATE 325 (65 FE) MG PO TABS
325.0000 mg | ORAL_TABLET | Freq: Every day | ORAL | Status: DC
  Administered 2018-05-12 – 2018-05-13 (×2): 325 mg via ORAL
  Filled 2018-05-11 (×2): qty 1

## 2018-05-11 MED ORDER — ACETAMINOPHEN 325 MG PO TABS
650.0000 mg | ORAL_TABLET | ORAL | Status: DC | PRN
  Administered 2018-05-12 (×2): 650 mg via ORAL
  Filled 2018-05-11 (×2): qty 2

## 2018-05-11 MED ORDER — ONDANSETRON HCL 4 MG/2ML IJ SOLN: 4.0000 mg | INTRAMUSCULAR | Status: DC | PRN

## 2018-05-11 MED ORDER — LIDOCAINE HCL (PF) 1 % IJ SOLN: 30.0000 mL | INTRAMUSCULAR | Status: DC | PRN

## 2018-05-11 MED ORDER — BENZOCAINE-MENTHOL 20-0.5 % EX AERO
1.0000 "application " | INHALATION_SPRAY | CUTANEOUS | Status: DC | PRN
  Administered 2018-05-11: 1 via TOPICAL

## 2018-05-11 MED ORDER — DIBUCAINE 1 % RE OINT: 1.0000 "application " | TOPICAL_OINTMENT | RECTAL | Status: DC | PRN

## 2018-05-11 MED ORDER — ONDANSETRON HCL 4 MG/2ML IJ SOLN
4.0000 mg | Freq: Four times a day (QID) | INTRAMUSCULAR | Status: DC | PRN
  Administered 2018-05-11 (×2): 4 mg via INTRAVENOUS
  Filled 2018-05-11 (×2): qty 2

## 2018-05-11 MED ORDER — IBUPROFEN 600 MG PO TABS
600.0000 mg | ORAL_TABLET | Freq: Four times a day (QID) | ORAL | Status: DC
  Administered 2018-05-11 – 2018-05-13 (×7): 600 mg via ORAL
  Filled 2018-05-11 (×7): qty 1

## 2018-05-11 MED ORDER — ACETAMINOPHEN 325 MG PO TABS: 650.0000 mg | ORAL_TABLET | ORAL | Status: DC | PRN

## 2018-05-11 MED ORDER — OXYTOCIN 10 UNIT/ML IJ SOLN: 10.0000 [IU] | Freq: Once | INTRAMUSCULAR | Status: DC

## 2018-05-11 MED ORDER — LIDOCAINE HCL (PF) 1 % IJ SOLN
INTRAMUSCULAR | Status: DC | PRN
  Administered 2018-05-11: 3 mL via SUBCUTANEOUS

## 2018-05-11 MED ORDER — LACTATED RINGERS IV SOLN
500.0000 mL | INTRAVENOUS | Status: DC | PRN
  Administered 2018-05-11: 500 mL via INTRAVENOUS

## 2018-05-11 MED ORDER — MISOPROSTOL 200 MCG PO TABS
ORAL_TABLET | ORAL | Status: AC
  Filled 2018-05-11: qty 4

## 2018-05-11 MED ORDER — TETANUS-DIPHTH-ACELL PERTUSSIS 5-2.5-18.5 LF-MCG/0.5 IM SUSP: 0.5000 mL | Freq: Once | INTRAMUSCULAR | Status: DC

## 2018-05-11 NOTE — Progress Notes (Signed)
LABOR NOTE   Natalie Ray 37 y.o.@ at 5175w0d Active phase labor.  SUBJECTIVE:  Comfortable with epidural  OBJECTIVE:  BP (!) 103/47   Pulse 85   Temp 98.3 F (36.8 C)   Resp 16   Ht 5\' 8"  (1.727 m)   Wt 102.9 kg   LMP  (LMP Unknown)   SpO2 97%   BMI 34.48 kg/m  No intake/output data recorded.  She has shown cervical change. CERVIX: not evaluated: Per RN Exam SVE:   Dilation: 6 Effacement (%): 90 Station: -1, 0 Exam by:: Christean LeafM Sweeney RN CONTRACTIONS: regular, every 3 minutes FHR: Fetal heart tracing reviewed. Baseline: 140 bpm, Variability: Good {> 6 bpm), Accelerations: Reactive and Decelerations: Absent Category I   Analgesia: Epidural  Labs: Lab Results  Component Value Date   WBC 11.4 (H) 05/11/2018   HGB 11.8 (L) 05/11/2018   HCT 35.4 (L) 05/11/2018   MCV 87.2 05/11/2018   PLT 176 05/11/2018    ASSESSMENT: 1) Labor curve reviewed.       Progress: Active phase labor.     Membranes: ruptured           Active Problems:   Labor and delivery, indication for care   PLAN: continue present management. Discussed use of pitocin if contraction frequency decreases. PT verbalizes and agrees to plan of care.    Doreene Burkennie Shera Laubach, CNM  05/11/2018 12:25 PM
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
## 2018-05-11 NOTE — Anesthesia Preprocedure Evaluation (Signed)
Anesthesia Evaluation  Patient identified by MRN, date of birth, ID band Patient awake    Reviewed: Allergy & Precautions, H&P , NPO status , Patient's Chart, lab work & pertinent test results  History of Anesthesia Complications Negative for: history of anesthetic complications  Airway Mallampati: II   Neck ROM: full    Dental no notable dental hx. (+) Teeth Intact   Pulmonary former smoker,           Cardiovascular hypertension,      Neuro/Psych    GI/Hepatic   Endo/Other    Renal/GU      Musculoskeletal   Abdominal   Peds  Hematology   Anesthesia Other Findings   Reproductive/Obstetrics (+) Pregnancy                             Anesthesia Physical Anesthesia Plan  ASA: II  Anesthesia Plan: Epidural   Post-op Pain Management:    Induction:   PONV Risk Score and Plan:   Airway Management Planned:   Additional Equipment:   Intra-op Plan:   Post-operative Plan:   Informed Consent: I have reviewed the patients History and Physical, chart, labs and discussed the procedure including the risks, benefits and alternatives for the proposed anesthesia with the patient or authorized representative who has indicated his/her understanding and acceptance.     Plan Discussed with:   Anesthesia Plan Comments:         Anesthesia Quick Evaluation

## 2018-05-11 NOTE — Anesthesia Procedure Notes (Signed)
Epidural  Start time: 05/11/2018 10:22 AM End time: 05/11/2018 10:34 AM  Staffing Anesthesiologist: Lenard SimmerKarenz, Andrew, MD Resident/CRNA: Irving BurtonBachich, An Schnabel, CRNA Performed: resident/CRNA   Preanesthetic Checklist Completed: patient identified, site marked, surgical consent, pre-op evaluation, IV checked, risks and benefits discussed and monitors and equipment checked  Epidural Patient position: sitting Prep: ChloraPrep and site prepped and draped Patient monitoring: heart rate, continuous pulse ox and blood pressure Approach: midline Location: L3-L4 Injection technique: LOR air  Needle:  Needle type: Tuohy  Needle gauge: 17 G Needle length: 9 cm Needle insertion depth: 7 cm Catheter type: closed end flexible Catheter size: 19 Gauge Catheter at skin depth: 12 cm Test dose: negative and 1.5% lidocaine with Epi 1:200 K  Assessment Events: blood not aspirated, injection not painful, no injection resistance, negative IV test and no paresthesia  Additional Notes Reason for block:procedure for pain

## 2018-05-12 LAB — CBC
HEMATOCRIT: 29.7 % — AB (ref 36.0–46.0)
Hemoglobin: 9.6 g/dL — ABNORMAL LOW (ref 12.0–15.0)
MCH: 28.6 pg (ref 26.0–34.0)
MCHC: 32.3 g/dL (ref 30.0–36.0)
MCV: 88.4 fL (ref 80.0–100.0)
NRBC: 0 % (ref 0.0–0.2)
Platelets: 145 10*3/uL — ABNORMAL LOW (ref 150–400)
RBC: 3.36 MIL/uL — ABNORMAL LOW (ref 3.87–5.11)
RDW: 14 % (ref 11.5–15.5)
WBC: 15.8 10*3/uL — AB (ref 4.0–10.5)

## 2018-05-12 LAB — RPR: RPR Ser Ql: NONREACTIVE

## 2018-05-12 MED ORDER — DOCUSATE SODIUM 100 MG PO CAPS: 100.0000 mg | ORAL_CAPSULE | Freq: Two times a day (BID) | ORAL | 0 refills | Status: DC

## 2018-05-12 MED ORDER — IBUPROFEN 600 MG PO TABS: 600.0000 mg | ORAL_TABLET | Freq: Four times a day (QID) | ORAL | 0 refills | Status: DC

## 2018-05-12 MED ORDER — NORETHINDRONE 0.35 MG PO TABS: 1.0000 | ORAL_TABLET | Freq: Every day | ORAL | 11 refills | Status: DC

## 2018-05-12 MED ORDER — BREAST MILK: ORAL | Status: DC

## 2018-05-12 MED ORDER — FERROUS SULFATE 325 (65 FE) MG PO TABS: 325.0000 mg | ORAL_TABLET | Freq: Two times a day (BID) | ORAL | 3 refills | Status: DC

## 2018-05-12 NOTE — Lactation Note (Signed)
This note was copied from a baby's chart. Lactation Consultation Note  Patient Name: Boy Randa EvensJacklyn Belvedere UJWJX'BToday's Date: 05/12/2018 Reason for consult: Initial assessment   Maternal Data   Mother with inverted nipples that everted during her pregnancy. Feeding Baby latched with a shield and there was colostrum visible. We discussed post pumping and feeding the baby the pumped milk.   LATCH Score Latch: Repeated attempts needed to sustain latch, nipple held in mouth throughout feeding, stimulation needed to elicit sucking reflex.  Audible Swallowing: Spontaneous and intermittent  Type of Nipple: Flat(short)  Comfort (Breast/Nipple): Filling, red/small blisters or bruises, mild/mod discomfort  Hold (Positioning): Assistance needed to correctly position infant at breast and maintain latch.  LATCH Score: 6  Interventions Interventions: Assisted with latch;Breast feeding basics reviewed;Adjust position  Lactation Tools Discussed/Used     Consult Status Consult Status: Complete    Trudee GripCarolyn P Sae Handrich 05/12/2018, 12:32 PM

## 2018-05-12 NOTE — Final Progress Note (Signed)
Discharge Day SOAP Note:  Progress Note - Vaginal Delivery  Natalie EvensJacklyn Standifer is a 37 y.o. G1P1001 now PP day 1 s/p Vaginal, Spontaneous . Delivery was uncomplicated  Subjective  The patient has the following complaints: has no unusual complaints  Pain is controlled with current medications.   Patient is urinating without difficulty.  She is ambulating well.     Objective  Vital signs: BP 124/90 (BP Location: Right Arm)   Pulse 98   Temp 98 F (36.7 C) (Oral)   Resp 18   Ht 5\' 8"  (1.727 m)   Wt 102.9 kg   LMP  (LMP Unknown)   SpO2 98%   Breastfeeding? Unknown   BMI 34.48 kg/m   Physical Exam: Gen: NAD Fundus Fundal Tone: Firm  Lochia Amount: Small  Perineum Appearance: Approximated     Data Review Labs: CBC Latest Ref Rng & Units 05/12/2018 05/11/2018 04/27/2018  WBC 4.0 - 10.5 K/uL 15.8(H) 11.4(H) 11.5(H)  Hemoglobin 12.0 - 15.0 g/dL 1.6(X9.6(L) 11.8(L) 11.2(L)  Hematocrit 36.0 - 46.0 % 29.7(L) 35.4(L) 33.4(L)  Platelets 150 - 400 K/uL 145(L) 176 194   A POS  Assessment/Plan  Active Problems:   Labor and delivery, indication for care    Plan for discharge today.   Discharge Instructions: Per After Visit Summary. Activity: Advance as tolerated. Pelvic rest for 6 weeks.  Also refer to After Visit Summary Diet: Regular Medications: Allergies as of 05/12/2018   No Known Allergies     Medication List    TAKE these medications   CITRANATAL BLOOM 90-1 MG Tabs Take 1 tablet by mouth daily.   docusate sodium 100 MG capsule Commonly known as:  COLACE Take 1 capsule (100 mg total) by mouth 2 (two) times daily.   ibuprofen 600 MG tablet Commonly known as:  ADVIL,MOTRIN Take 1 tablet (600 mg total) by mouth every 6 (six) hours.   norethindrone 0.35 MG tablet Commonly known as:  MICRONOR,CAMILA,ERRIN Take 1 tablet (0.35 mg total) by mouth daily.   pantoprazole 40 MG tablet Commonly known as:  PROTONIX Take 1 tablet (40 mg total) by mouth daily.       Outpatient follow up: 6 weeks with Doreene BurkeAnnie Laiylah Roettger, CNM  Postpartum contraception: progestin only pill to start @ 4 wks postpartum   Discharged Condition: good  Discharged to: home  Newborn Data: Disposition:home with mother  Apgars: APGAR (1 MIN): 8   APGAR (5 MINS): 9   APGAR (10 MINS):    Baby Feeding: Breast    Doreene Burkennie Lucus Lambertson, CNM  05/12/2018 10:55 AM

## 2018-05-12 NOTE — Anesthesia Postprocedure Evaluation (Signed)
Anesthesia Post Note  Patient: Natalie Ray  Procedure(s) Performed: AN AD HOC LABOR EPIDURAL  Patient location during evaluation: Mother Baby Anesthesia Type: Epidural Level of consciousness: awake and alert and oriented Pain management: pain level controlled Vital Signs Assessment: post-procedure vital signs reviewed and stable Respiratory status: spontaneous breathing Cardiovascular status: blood pressure returned to baseline Postop Assessment: no headache, no backache and patient able to bend at knees Anesthetic complications: no     Last Vitals:  Vitals:   05/12/18 0500 05/12/18 0750  BP: 114/63 124/90  Pulse: 92 98  Resp: 18 18  Temp: 36.8 C 36.7 C  SpO2: 98% 98%    Last Pain:  Vitals:   05/12/18 0934  TempSrc:   PainSc: 0-No pain                 Natalie Ray

## 2018-05-12 NOTE — Discharge Summary (Signed)
                            Discharge Summary  Date of Admission: 05/11/2018  Date of Discharge: 05/12/2018  Admitting Diagnosis: Induction of labor at 7889w0d  Mode of Delivery: normal spontaneous vaginal delivery                 Discharge Diagnosis: gestational hypertension   Intrapartum Procedures: Atificial rupture of membranes and epidural   Post partum procedures: none  Complications: none and second degree perineal laceration                      Discharge Day SOAP Note:  Progress Note - Vaginal Delivery  Natalie Ray is a 37 y.o. G1P1001 now PP day 1 s/p Vaginal, Spontaneous . Delivery was uncomplicated  Subjective  The patient has the following complaints: has no unusual complaints  Pain is controlled with current medications.   Patient is urinating without difficulty.  She is ambulating well.     Objective  Vital signs: BP 124/90 (BP Location: Right Arm)   Pulse 98   Temp 98 F (36.7 C) (Oral)   Resp 18   Ht 5\' 8"  (1.727 m)   Wt 102.9 kg   LMP  (LMP Unknown)   SpO2 98%   Breastfeeding? Unknown   BMI 34.48 kg/m   Physical Exam: Gen: NAD Fundus Fundal Tone: Firm  Lochia Amount: Small  Perineum Appearance: Approximated     Data Review Labs: CBC Latest Ref Rng & Units 05/12/2018 05/11/2018 04/27/2018  WBC 4.0 - 10.5 K/uL 15.8(H) 11.4(H) 11.5(H)  Hemoglobin 12.0 - 15.0 g/dL 9.6(E9.6(L) 11.8(L) 11.2(L)  Hematocrit 36.0 - 46.0 % 29.7(L) 35.4(L) 33.4(L)  Platelets 150 - 400 K/uL 145(L) 176 194   A POS  Assessment/Plan  Active Problems:   Labor and delivery, indication for care    Plan for discharge today.   Discharge Instructions: Per After Visit Summary. Activity: Advance as tolerated. Pelvic rest for 6 weeks.  Also refer to After Visit Summary Diet: Regular Medications: Allergies as of 05/12/2018   No Known Allergies     Medication List    TAKE these medications   CITRANATAL BLOOM 90-1 MG Tabs Take 1 tablet by mouth daily.    docusate sodium 100 MG capsule Commonly known as:  COLACE Take 1 capsule (100 mg total) by mouth 2 (two) times daily.   ibuprofen 600 MG tablet Commonly known as:  ADVIL,MOTRIN Take 1 tablet (600 mg total) by mouth every 6 (six) hours.   norethindrone 0.35 MG tablet Commonly known as:  MICRONOR,CAMILA,ERRIN Take 1 tablet (0.35 mg total) by mouth daily.   pantoprazole 40 MG tablet Commonly known as:  PROTONIX Take 1 tablet (40 mg total) by mouth daily.      Outpatient follow up: 6 weeks with Doreene BurkeAnnie Kaisha Wachob, CNM  Postpartum contraception: progestin only pill to start @ 4 wks postpartum   Discharged Condition: good  Discharged to: home  Newborn Data: Disposition:home with mother  Apgars: APGAR (1 MIN): 8   APGAR (5 MINS): 9   APGAR (10 MINS):    Baby Feeding: Breast    Doreene Burkennie Tekela Garguilo, CNM  05/12/2018 10:55 AM

## 2018-05-13 NOTE — Plan of Care (Signed)
Pt VS stable; up ad lib; tolerating regular diet; taking motrin for pain control; using breast pump well (baby in SCN now); has ambulated several times to SCN to see baby

## 2018-05-13 NOTE — Lactation Note (Signed)
Lactation Consultation Note  Patient Name: Natalie EvensJacklyn Ray MVHQI'OToday's Date: 05/13/2018   Mom with baby in SCN, spoke with FOB about breast pump access at home, mom has a Medela pump and style advanced for use at home   Maternal Data    Feeding    LATCH Score                   Interventions    Lactation Tools Discussed/Used     Consult Status      Dyann KiefMarsha D Vergil Burby 05/13/2018, 4:04 PM

## 2018-05-13 NOTE — Final Progress Note (Signed)
Discharge Day SOAP Note:  Progress Note - Vaginal Delivery  Natalie EvensJacklyn Okray is a 37 y.o. G1P1001 now PP day 2 s/p Vaginal, Spontaneous . Delivery was uncomplicated. Pt was planning on going home yesterday but d/c was delayed to to fetal indications. No change in pt status.   Subjective  The patient has the following complaints: has no unusual complaints  Pain is controlled with current medications.   Patient is urinating without difficulty.  She is ambulating well.     Objective  Vital signs: BP 117/79 (BP Location: Right Arm)   Pulse 82   Temp 98 F (36.7 C) (Oral)   Resp 18   Ht 5\' 8"  (1.727 m)   Wt 102.9 kg   LMP  (LMP Unknown)   SpO2 99%   Breastfeeding? Unknown   BMI 34.48 kg/m   Physical Exam: Gen: NAD Fundus Fundal Tone: Firm  Lochia Amount: Scant  Perineum Appearance: Edematous     Data Review Labs: CBC Latest Ref Rng & Units 05/12/2018 05/11/2018 04/27/2018  WBC 4.0 - 10.5 K/uL 15.8(H) 11.4(H) 11.5(H)  Hemoglobin 12.0 - 15.0 g/dL 8.1(X9.6(L) 11.8(L) 11.2(L)  Hematocrit 36.0 - 46.0 % 29.7(L) 35.4(L) 33.4(L)  Platelets 150 - 400 K/uL 145(L) 176 194   A POS  Assessment/Plan  Active Problems:   Labor and delivery, indication for care    Plan for discharge today.   Discharge Instructions: Per After Visit Summary. Activity: Advance as tolerated. Pelvic rest for 6 weeks.  Also refer to After Visit Summary Diet: Regular Medications: Allergies as of 05/13/2018   No Known Allergies     Medication List    TAKE these medications   CITRANATAL BLOOM 90-1 MG Tabs Take 1 tablet by mouth daily.   docusate sodium 100 MG capsule Commonly known as:  COLACE Take 1 capsule (100 mg total) by mouth 2 (two) times daily.   ferrous sulfate 325 (65 FE) MG tablet Take 1 tablet (325 mg total) by mouth 2 (two) times daily with a meal.   ibuprofen 600 MG tablet Commonly known as:  ADVIL,MOTRIN Take 1 tablet (600 mg total) by mouth every 6 (six) hours.    norethindrone 0.35 MG tablet Commonly known as:  MICRONOR,CAMILA,ERRIN Take 1 tablet (0.35 mg total) by mouth daily.   pantoprazole 40 MG tablet Commonly known as:  PROTONIX Take 1 tablet (40 mg total) by mouth daily.      Outpatient follow up:  Follow-up Information    Doreene Burkehompson, Yer Castello, CNM. Schedule an appointment as soon as possible for a visit in 6 week(s).   Specialties:  Certified Nurse Midwife, Radiology Contact information: [address] [place] [address] [phone]          Postpartum contraception: progestin only pill  Discharged Condition: good  Discharged to: home  Newborn Data: Disposition:NICU  Apgars: APGAR (1 MIN): 8   APGAR (5 MINS): 9   APGAR (10 MINS):    Baby Feeding: Breast    Doreene Burkennie Kazi Montoro, CNM  05/13/2018 10:20 AM

## 2018-05-13 NOTE — Progress Notes (Signed)
Pt discharged with infant.  Discharge instructions, prescriptions and follow up appointment given to and reviewed with pt. Pt verbalized understanding. Escorted out by auxillary. 

## 2018-05-13 NOTE — Discharge Summary (Signed)
@[email or web address] @                            Discharge Summary  Date of Admission: 05/11/2018  Date of Discharge: 05/13/2018  Admitting Diagnosis: Induction of labor at [redacted]w[redacted]d, gestational hypertension  Mode of Delivery: normal spontaneous vaginal delivery                 Discharge Diagnosis: Gestational hypertension   Intrapartum Procedures: Atificial rupture of membranes and laceration 2nd   Post partum procedures: none  Complications: none                      Discharge Day SOAP Note:  Progress Note - Vaginal Delivery  Natalie Ray is a 37 y.o. G1P1001 now PP day 2 s/p Vaginal, Spontaneous . Delivery was uncomplicated. Pt was planning on going home yesterday but d/c was delayed to to fetal indications. No change in pt status.   Subjective  The patient has the following complaints: has no unusual complaints  Pain is controlled with current medications.   Patient is urinating without difficulty.  She is ambulating well.     Objective  Vital signs: BP 117/79 (BP Location: Right Arm)   Pulse 82   Temp 98 F (36.7 C) (Oral)   Resp 18   Ht 5\' 8"  (1.727 m)   Wt 102.9 kg   LMP  (LMP Unknown)   SpO2 99%   Breastfeeding? Unknown   BMI 34.48 kg/m   Physical Exam: Gen: NAD Fundus Fundal Tone: Firm  Lochia Amount: Scant  Perineum Appearance: Edematous     Data Review Labs: CBC Latest Ref Rng & Units 05/12/2018 05/11/2018 04/27/2018  WBC 4.0 - 10.5 K/uL 15.8(H) 11.4(H) 11.5(H)  Hemoglobin 12.0 - 15.0 g/dL 1.6(X) 11.8(L) 11.2(L)  Hematocrit 36.0 - 46.0 % 29.7(L) 35.4(L) 33.4(L)  Platelets 150 - 400 K/uL 145(L) 176 194   A POS  Assessment/Plan  Active Problems:   Labor and delivery, indication for care    Plan for discharge today.   Discharge Instructions: Per After Visit Summary. Activity: Advance as tolerated. Pelvic rest for 6 weeks.  Also refer to After Visit Summary Diet: Regular Medications: Allergies as of 05/13/2018   No Known Allergies      Medication List    TAKE these medications   CITRANATAL BLOOM 90-1 MG Tabs Take 1 tablet by mouth daily.   docusate sodium 100 MG capsule Commonly known as:  COLACE Take 1 capsule (100 mg total) by mouth 2 (two) times daily.   ferrous sulfate 325 (65 FE) MG tablet Take 1 tablet (325 mg total) by mouth 2 (two) times daily with a meal.   ibuprofen 600 MG tablet Commonly known as:  ADVIL,MOTRIN Take 1 tablet (600 mg total) by mouth every 6 (six) hours.   norethindrone 0.35 MG tablet Commonly known as:  MICRONOR,CAMILA,ERRIN Take 1 tablet (0.35 mg total) by mouth daily.   pantoprazole 40 MG tablet Commonly known as:  PROTONIX Take 1 tablet (40 mg total) by mouth daily.      Outpatient follow up:  Follow-up Information    [name], CNM. Schedule an appointment as soon as possible for a visit in 6 week(s).   Specialties:  Certified Nurse Midwife, Radiology Contact information: [address] [place] [address] [phone]          Postpartum contraception: progestin only pill  Discharged Condition: good  Discharged to:  home  Newborn Data: Disposition:NICU  Apgars: APGAR (1 MIN): 8   APGAR (5 MINS): 9   APGAR (10 MINS):    Baby Feeding: Breast    Doreene Burkennie Jaia Alonge, CNM  05/13/2018 10:20 AM

## 2018-05-24 ENCOUNTER — Telehealth: Payer: Self-pay

## 2018-05-24 NOTE — Telephone Encounter (Signed)
Pt is 2 weeks PP. S/p Vaginal delivery. She would like know if she can walk on the treadmill.   Light bleeding only.  Pos breastfeeding.

## 2018-06-20 ENCOUNTER — Ambulatory Visit (INDEPENDENT_AMBULATORY_CARE_PROVIDER_SITE_OTHER): Payer: 59 | Admitting: Certified Nurse Midwife

## 2018-06-20 DIAGNOSIS — Z3483 Encounter for supervision of other normal pregnancy, third trimester: Secondary | ICD-10-CM | POA: Diagnosis not present

## 2018-06-20 DIAGNOSIS — Z3482 Encounter for supervision of other normal pregnancy, second trimester: Secondary | ICD-10-CM | POA: Diagnosis not present

## 2018-06-20 NOTE — Patient Instructions (Signed)
Preventive Care 18-39 Years, Female Preventive care refers to lifestyle choices and visits with your health care provider that can promote health and wellness. What does preventive care include?   A yearly physical exam. This is also called an annual well check.  Dental exams once or twice a year.  Routine eye exams. Ask your health care provider how often you should have your eyes checked.  Personal lifestyle choices, including: ? Daily care of your teeth and gums. ? Regular physical activity. ? Eating a healthy diet. ? Avoiding tobacco and drug use. ? Limiting alcohol use. ? Practicing safe sex. ? Taking vitamin and mineral supplements as recommended by your health care provider. What happens during an annual well check? The services and screenings done by your health care provider during your annual well check will depend on your age, overall health, lifestyle risk factors, and family history of disease. Counseling Your health care provider may ask you questions about your:  Alcohol use.  Tobacco use.  Drug use.  Emotional well-being.  Home and relationship well-being.  Sexual activity.  Eating habits.  Work and work environment.  Method of birth control.  Menstrual cycle.  Pregnancy history. Screening You may have the following tests or measurements:  Height, weight, and BMI.  Diabetes screening. This is done by checking your blood sugar (glucose) after you have not eaten for a while (fasting).  Blood pressure.  Lipid and cholesterol levels. These may be checked every 5 years starting at age 20.  Skin check.  Hepatitis C blood test.  Hepatitis B blood test.  Sexually transmitted disease (STD) testing.  BRCA-related cancer screening. This may be done if you have a family history of breast, ovarian, tubal, or peritoneal cancers.  Pelvic exam and Pap test. This may be done every 3 years starting at age 21. Starting at age 30, this may be done every 5  years if you have a Pap test in combination with an HPV test. Discuss your test results, treatment options, and if necessary, the need for more tests with your health care provider. Vaccines Your health care provider may recommend certain vaccines, such as:  Influenza vaccine. This is recommended every year.  Tetanus, diphtheria, and acellular pertussis (Tdap, Td) vaccine. You may need a Td booster every 10 years.  Varicella vaccine. You may need this if you have not been vaccinated.  HPV vaccine. If you are 26 or younger, you may need three doses over 6 months.  Measles, mumps, and rubella (MMR) vaccine. You may need at least one dose of MMR. You may also need a second dose.  Pneumococcal 13-valent conjugate (PCV13) vaccine. You may need this if you have certain conditions and were not previously vaccinated.  Pneumococcal polysaccharide (PPSV23) vaccine. You may need one or two doses if you smoke cigarettes or if you have certain conditions.  Meningococcal vaccine. One dose is recommended if you are age 19-21 years and a first-year college student living in a residence hall, or if you have one of several medical conditions. You may also need additional booster doses.  Hepatitis A vaccine. You may need this if you have certain conditions or if you travel or work in places where you may be exposed to hepatitis A.  Hepatitis B vaccine. You may need this if you have certain conditions or if you travel or work in places where you may be exposed to hepatitis B.  Haemophilus influenzae type b (Hib) vaccine. You may need this if you   have certain risk factors. Talk to your health care provider about which screenings and vaccines you need and how often you need them. This information is not intended to replace advice given to you by your health care provider. Make sure you discuss any questions you have with your health care provider. Document Released: 07/28/2001 Document Revised: 01/12/2017  Document Reviewed: 04/02/2015 Elsevier Interactive Patient Education  2019 [employer].

## 2018-06-20 NOTE — Progress Notes (Signed)
Subjective:    Natalie Ray is a 38 y.o. G51P1001 Caucasian female who presents for a postpartum visit. She is 6 weeks postpartum following a spontaneous vaginal delivery at 39 gestational weeks. Anesthesia: epidural. I have fully reviewed the prenatal and intrapartum course. Postpartum course has been WNL. 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 Bleeding no bleeding. Bowel function is normal. Bladder function is normal. Patient is not sexually active. Last sexual activity: Prior to delivery Contraception method is oral progesterone-only contraceptive. Postpartum depression screening: negative. Score 0.  Last pap 12/17/2016 and was negative, HPV neg.  The following portions of the patient's history were reviewed and updated as appropriate: allergies, current medications, past medical history, past surgical history and problem list.  Review of Systems Pertinent items are noted in HPI.   Vitals:   06/20/18 0954  BP: (!) 106/59  Pulse: 65  Weight: 193 lb 1 oz (87.6 kg)  Height: 5\' 8"  (1.727 m)   No LMP recorded.  Objective:   General:  alert, cooperative and no distress   Breasts:  Right and left have small knots present, no redness  Lungs: clear to auscultation bilaterally  Heart:  regular rate and rhythm  Abdomen: soft, nontender   Vulva: normal  Vagina: normal vagina  Cervix:  closed  Corpus: Well-involuted  Adnexa:  Non-palpable  Rectal Exam: no hemorrhoids        Assessment:   Postpartum exam 6 wks s/p SVD Breast milk /pumping Depression screening Contraception counseling   Plan:  : progestin only pill Follow up in: 1 year for annual  or earlier if needed  [name], CNM

## 2018-07-01 ENCOUNTER — Encounter: Payer: Self-pay | Admitting: Certified Nurse Midwife

## 2018-07-27 DIAGNOSIS — Z3482 Encounter for supervision of other normal pregnancy, second trimester: Secondary | ICD-10-CM | POA: Diagnosis not present

## 2018-07-27 DIAGNOSIS — Z3483 Encounter for supervision of other normal pregnancy, third trimester: Secondary | ICD-10-CM | POA: Diagnosis not present

## 2018-08-29 DIAGNOSIS — Z3483 Encounter for supervision of other normal pregnancy, third trimester: Secondary | ICD-10-CM | POA: Diagnosis not present

## 2018-08-29 DIAGNOSIS — Z3482 Encounter for supervision of other normal pregnancy, second trimester: Secondary | ICD-10-CM | POA: Diagnosis not present

## 2018-11-28 ENCOUNTER — Other Ambulatory Visit: Payer: Self-pay

## 2018-11-28 MED ORDER — NORGESTIM-ETH ESTRAD TRIPHASIC 0.18/0.215/0.25 MG-35 MCG PO TABS: 1.0000 | ORAL_TABLET | Freq: Every day | ORAL | 11 refills | Status: DC

## 2018-12-07 ENCOUNTER — Other Ambulatory Visit: Payer: Self-pay | Admitting: Certified Nurse Midwife

## 2018-12-07 DIAGNOSIS — O99345 Other mental disorders complicating the puerperium: Secondary | ICD-10-CM

## 2018-12-07 DIAGNOSIS — F418 Other specified anxiety disorders: Secondary | ICD-10-CM

## 2019-02-09 ENCOUNTER — Telehealth (INDEPENDENT_AMBULATORY_CARE_PROVIDER_SITE_OTHER): Payer: 59 | Admitting: Family Medicine

## 2019-02-09 ENCOUNTER — Other Ambulatory Visit: Payer: Self-pay

## 2019-02-09 ENCOUNTER — Encounter: Payer: Self-pay | Admitting: Family Medicine

## 2019-02-09 DIAGNOSIS — J0140 Acute pansinusitis, unspecified: Secondary | ICD-10-CM

## 2019-02-09 DIAGNOSIS — J019 Acute sinusitis, unspecified: Secondary | ICD-10-CM | POA: Insufficient documentation

## 2019-02-09 MED ORDER — AMOXICILLIN-POT CLAVULANATE 875-125 MG PO TABS: 1.0000 | ORAL_TABLET | Freq: Two times a day (BID) | ORAL | 0 refills | Status: AC

## 2019-02-09 NOTE — Assessment & Plan Note (Signed)
New- Given duration and progression of symptoms, will treat for bacterial sinusitis with Augmentin twice daily x 10 days.  She is not wanting to use nasal sprays.  She will try mucinex DM for decongestant component as well.  Advised to drink fluids and to keep me updated. The patient indicates understanding of these issues and agrees with the plan.

## 2019-02-09 NOTE — Progress Notes (Signed)
Virtual Visit via Video   Due to the COVID-19 pandemic, this visit was completed with telemedicine (audio/video) technology to reduce patient and provider exposure as well as to preserve personal protective equipment.   I connected with Daquisha Henkes by a video enabled telemedicine application and verified that I am speaking with the correct person using two identifiers. Location patient: Home Location provider: [facility], Office Persons participating in the virtual visit: [place] Reg[place], Ruthe Mannanalia Damain Broadus, MD   I discussed the limitations of evaluation and management by telemedicine and the availability of in person appointments. The patient expressed understanding and agreed to proceed.  Care Team   Patient Care Team: Dianne DunAron, Patricio Popwell M, MD as PCP - General (Family Medicine)  Subjective:   HPI:   ? Sinus infection- complaints of over 1 week of worsening sinus pressure, congestion, sore throat, runny nose.  Work tested her for covid and per pt, it was negative (got results this morning).  No fevers.  Mucinex has helped with drainage. 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   Review of Systems  Constitutional: Negative.   HENT: Positive for congestion, ear pain and sinus pain. Negative for ear discharge, hearing loss, nosebleeds and tinnitus.   Respiratory: Negative.  Negative for stridor.   Gastrointestinal: Negative.   Genitourinary: Negative.   Musculoskeletal: Negative.   Skin: Negative.   Neurological: Negative.   Endo/Heme/Allergies: Negative.   Psychiatric/Behavioral: Negative.   All other systems reviewed and are negative.    Patient Active Problem List   Diagnosis Date Noted  . Acute sinusitis 02/09/2019  . Insomnia 03/24/2017    Social History   Tobacco Use  . Smoking status: Former Games developermoker  . Smokeless tobacco: Never Used  . Tobacco comment: quit April 29, 2015, smoked for 15 years   Substance Use Topics  . Alcohol use: Not Currently    Alcohol/week: 0.0 standard drinks    Current Outpatient Medications:  .  Norgestimate-Ethinyl Estradiol Triphasic (TRI-SPRINTEC) 0.18/0.215/0.25 MG-35 MCG tablet, Take 1 tablet by mouth at bedtime for 28 days., Disp: 1 Package, Rfl: 11 .  amoxicillin-clavulanate (AUGMENTIN) 875-125 MG tablet, Take 1 tablet by mouth 2 (two) times daily for 10 days., Disp: 20 tablet, Rfl: 0 .  docusate sodium (COLACE) 100 MG capsule, Take 1 capsule (100 mg total) by mouth 2 (two) times daily. (Patient not taking: Reported on 02/09/2019), Disp: 10 capsule, Rfl: 0 .  ferrous sulfate 325 (65 FE) MG tablet, Take 1 tablet (325 mg total) by mouth 2 (two) times daily with a meal. (Patient not taking: Reported on 02/09/2019), Disp: 60 tablet, Rfl: 3 .  norethindrone (MICRONOR,CAMILA,ERRIN) 0.35 MG tablet, Take 1 tablet (0.35 mg total) by mouth daily. (Patient not taking: Reported on 02/09/2019), Disp: 1 Package, Rfl: 11 .  Prenatal-DSS-FeCb-FeGl-FA (CITRANATAL BLOOM) 90-1 MG TABS, Take 1 tablet by mouth daily. (Patient not taking: Reported on 02/09/2019), Disp: 30 tablet, Rfl: 6  No Known Allergies  Objective:  BP (!) 155/88 Comment: pt reprt  Pulse 81 Comment: pt report  Temp 98.1 F (36.7 C) (Oral) Comment: pt reprt  Ht 5\' 8"  (1.727 m)   Wt 205 lb (93 kg) Comment: pt reprt  BMI 31.17 kg/m   VITALS: Per patient if applicable, see vitals. GENERAL: Alert, appears well and in no acute distress. HEENT: Atraumatic, conjunctiva clear, no obvious abnormalities on inspection of external nose and ears.  TTP over maxillary and frontal sinuses  when pt palpates. NECK: Normal movements of the head and neck. CARDIOPULMONARY: No increased WOB. Speaking in clear sentences. I:E ratio WNL.  MS: Moves all visible extremities without noticeable abnormality. PSYCH: Pleasant and cooperative, well-groomed. Speech normal rate and rhythm. Affect is appropriate. Insight and judgement  are appropriate. Attention is focused, linear, and appropriate.  NEURO: CN grossly intact. Oriented as arrived to appointment on time with no prompting. Moves both UE equally.  SKIN: No obvious lesions, wounds, erythema, or cyanosis noted on face or hands.  Depression screen [facility] 2/9 06/20/2018 12/17/2016 06/04/2015  Decreased Interest 0 0 0  Down, Depressed, Hopeless 0 0 0  PHQ - 2 Score 0 0 0  Altered sleeping 0 - -  Tired, decreased energy 0 - -  Change in appetite 0 - -  Feeling bad or failure about yourself  0 - -  Trouble concentrating 0 - -  Moving slowly or fidgety/restless 0 - -  Suicidal thoughts 0 - -  PHQ-9 Score 0 - -    Assessment and Plan:   Kailee was seen today for sinusitis.  Diagnoses and all orders for this visit:  Acute pansinusitis, recurrence not specified  Other orders -     amoxicillin-clavulanate (AUGMENTIN) 875-125 MG tablet; Take 1 tablet by mouth 2 (two) times daily for 10 days.    Marland Kitchen COVID-19 Education: The signs and symptoms of COVID-19 were discussed with the patient and how to seek care for testing if needed. The importance of social distancing was discussed today. . Reviewed expectations re: course of current medical issues. . Discussed self-management of symptoms. . Outlined signs and symptoms indicating need for more acute intervention. . Patient verbalized understanding and all questions were answered. Marland Kitchen Health Maintenance issues including appropriate healthy diet, exercise, and smoking avoidance were discussed with patient. . See orders for this visit as documented in the electronic medical record.  Arnette Norris, MD  Records requested if needed. Time spent:15 minutes, of which >50% was spent in obtaining information about her symptoms, reviewing her previous labs, evaluations, and treatments, counseling her about her condition (please see the discussed topics above), and developing a plan to further investigate it; she had a number of questions  which I addressed.

## 2019-03-23 ENCOUNTER — Encounter: Payer: Self-pay | Admitting: Family Medicine

## 2019-03-24 MED ORDER — TRAZODONE HCL 50 MG PO TABS: 25.0000 mg | ORAL_TABLET | Freq: Every evening | ORAL | 3 refills | Status: DC | PRN

## 2019-06-16 NOTE — L&D Delivery Note (Signed)
       Delivery Note   Karia Detlefsen is a 39 y.o. G2P2002 at [redacted]w[redacted]d Estimated Date of Delivery: 05/14/20  PRE-OPERATIVE DIAGNOSIS:  1) [redacted]w[redacted]d pregnancy.   POST-OPERATIVE DIAGNOSIS:  1) [redacted]w[redacted]d pregnancy s/p Vaginal, Spontaneous   Delivery Type: Vaginal, Spontaneous    Delivery Anesthesia: Epidural   Labor Complications:  op presentation    ESTIMATED BLOOD LOSS: 600  ml    FINDINGS:   1) female infant, Apgar scores of 8   at 1 minute and 9   at 5 minutes and a birthweight pending infant remains skin to skin .   2) Nuchal cord: yes, nuchal x1 and body x 1   SPECIMENS:   PLACENTA:   Appearance: Intact , 3 vessel cord   Removal: Spontaneous      Disposition:  held per protocol , then discarded  DISPOSITION:  Infant to left in stable condition in the delivery room, with L&D personnel and mother,  NARRATIVE SUMMARY: Labor course:  Ms. Binnie Shelburne is a [id number] at [redacted]w[redacted]d who presented for induction of labor.  She progressed well in labor with cytotec and pitocin.  She received the appropriate epidural anesthesia and proceeded to complete dilation. She evidenced good maternal expulsive effort during the second stage. She went on to deliver a viable female infant "Tobi Bastos". The placenta delivered without problems and was noted to be complete. A perineal and vaginal examination was performed. Episiotomy/Lacerations: 2nd degree;Perineal  The laceration was repaired with 3-0 & 4-0 Vicryl rapide suture. Pressure held on perineum for 5 min to allow for hemostasis, lap placed at the perineum for pressure dressing. Bleeding slowed . Will continue to monitor. The patient tolerated this well.  [name], CNM  05/16/2020 8:23 PM

## 2019-06-23 ENCOUNTER — Encounter: Payer: Self-pay | Admitting: Certified Nurse Midwife

## 2019-06-23 ENCOUNTER — Ambulatory Visit (INDEPENDENT_AMBULATORY_CARE_PROVIDER_SITE_OTHER): Payer: 59 | Admitting: Certified Nurse Midwife

## 2019-06-23 ENCOUNTER — Other Ambulatory Visit: Payer: Self-pay

## 2019-06-23 VITALS — BP 107/77 | HR 75 | Ht 68.0 in | Wt 205.6 lb

## 2019-06-23 DIAGNOSIS — Z141 Cystic fibrosis carrier: Secondary | ICD-10-CM | POA: Diagnosis not present

## 2019-06-23 DIAGNOSIS — Z01419 Encounter for gynecological examination (general) (routine) without abnormal findings: Secondary | ICD-10-CM | POA: Diagnosis not present

## 2019-06-23 NOTE — Patient Instructions (Signed)
Preventive Care 21-37 Years Old, Female Preventive care refers to visits with your health care provider and lifestyle choices that can promote health and wellness. This includes:  A yearly physical exam. This may also be called an annual well check.  Regular dental visits and eye exams.  Immunizations.  Screening for certain conditions.  Healthy lifestyle choices, such as eating a healthy diet, getting regular exercise, not using drugs or products that contain nicotine and tobacco, and limiting alcohol use. What can I expect for my preventive care visit? Physical exam Your health care provider will check your:  Height and weight. This may be used to calculate body mass index (BMI), which tells if you are at a healthy weight.  Heart rate and blood pressure.  Skin for abnormal spots. Counseling Your health care provider may ask you questions about your:  Alcohol, tobacco, and drug use.  Emotional well-being.  Home and relationship well-being.  Sexual activity.  Eating habits.  Work and work environment.  Method of birth control.  Menstrual cycle.  Pregnancy history. What immunizations do I need?  Influenza (flu) vaccine  This is recommended every year. Tetanus, diphtheria, and pertussis (Tdap) vaccine  You may need a Td booster every 10 years. Varicella (chickenpox) vaccine  You may need this if you have not been vaccinated. Human papillomavirus (HPV) vaccine  If recommended by your health care provider, you may need three doses over 6 months. Measles, mumps, and rubella (MMR) vaccine  You may need at least one dose of MMR. You may also need a second dose. Meningococcal conjugate (MenACWY) vaccine  One dose is recommended if you are age 19-21 years and a first-year college student living in a residence hall, or if you have one of several medical conditions. You may also need additional booster doses. Pneumococcal conjugate (PCV13) vaccine  You may need  this if you have certain conditions and were not previously vaccinated. Pneumococcal polysaccharide (PPSV23) vaccine  You may need one or two doses if you smoke cigarettes or if you have certain conditions. Hepatitis A vaccine  You may need this if you have certain conditions or if you travel or work in places where you may be exposed to hepatitis A. Hepatitis B vaccine  You may need this if you have certain conditions or if you travel or work in places where you may be exposed to hepatitis B. Haemophilus influenzae type b (Hib) vaccine  You may need this if you have certain conditions. You may receive vaccines as individual doses or as more than one vaccine together in one shot (combination vaccines). Talk with your health care provider about the risks and benefits of combination vaccines. What tests do I need?  Blood tests  Lipid and cholesterol levels. These may be checked every 5 years starting at age 20.  Hepatitis C test.  Hepatitis B test. Screening  Diabetes screening. This is done by checking your blood sugar (glucose) after you have not eaten for a while (fasting).  Sexually transmitted disease (STD) testing.  BRCA-related cancer screening. This may be done if you have a family history of breast, ovarian, tubal, or peritoneal cancers.  Pelvic exam and Pap test. This may be done every 3 years starting at age 21. Starting at age 30, this may be done every 5 years if you have a Pap test in combination with an HPV test. Talk with your health care provider about your test results, treatment options, and if necessary, the need for more tests.   Follow these instructions at home: Eating and drinking   Eat a diet that includes fresh fruits and vegetables, whole grains, lean protein, and low-fat dairy.  Take vitamin and mineral supplements as recommended by your health care provider.  Do not drink alcohol if: ? Your health care provider tells you not to drink. ? You are  pregnant, may be pregnant, or are planning to become pregnant.  If you drink alcohol: ? Limit how much you have to 0-1 drink a day. ? Be aware of how much alcohol is in your drink. In the [place] one drink equals one 12 oz bottle of beer (355 mL), one 5 oz glass of wine (148 mL), or one 1 oz glass of hard liquor (44 mL). Lifestyle  Take daily care of your teeth and gums.  Stay active. Exercise for at least 30 minutes on 5 or more days each week.  Do not use any products that contain nicotine or tobacco, such as cigarettes, e-cigarettes, and chewing tobacco. If you need help quitting, ask your health care provider.  If you are sexually active, practice safe sex. Use a condom or other form of birth control (contraception) in order to prevent pregnancy and STIs (sexually transmitted infections). If you plan to become pregnant, see your health care provider for a preconception visit. What's next?  Visit your health care provider once a year for a well check visit.  Ask your health care provider how often you should have your eyes and teeth checked.  Stay up to date on all vaccines. This information is not intended to replace advice given to you by your health care provider. Make sure you discuss any questions you have with your health care provider. Document Revised: 02/10/2018 Document Reviewed: 02/10/2018 Elsevier Patient Education  2020 [employer].
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
## 2019-10-17 ENCOUNTER — Other Ambulatory Visit (INDEPENDENT_AMBULATORY_CARE_PROVIDER_SITE_OTHER): Payer: 59

## 2019-10-17 ENCOUNTER — Other Ambulatory Visit: Payer: Self-pay

## 2019-10-17 ENCOUNTER — Other Ambulatory Visit: Payer: Self-pay | Admitting: Certified Nurse Midwife

## 2019-10-17 ENCOUNTER — Encounter: Payer: Self-pay | Admitting: Certified Nurse Midwife

## 2019-10-17 ENCOUNTER — Ambulatory Visit: Payer: 59 | Admitting: Certified Nurse Midwife

## 2019-10-17 VITALS — BP 106/54 | HR 77 | Ht 68.0 in | Wt 222.3 lb

## 2019-10-17 DIAGNOSIS — Z3A10 10 weeks gestation of pregnancy: Secondary | ICD-10-CM | POA: Diagnosis not present

## 2019-10-17 DIAGNOSIS — Z789 Other specified health status: Secondary | ICD-10-CM

## 2019-10-17 DIAGNOSIS — N926 Irregular menstruation, unspecified: Secondary | ICD-10-CM | POA: Diagnosis not present

## 2019-10-17 LAB — POCT URINE PREGNANCY: Preg Test, Ur: POSITIVE — AB

## 2019-10-17 MED ORDER — DOXYLAMINE-PYRIDOXINE 10-10 MG PO TBEC: 2.0000 | DELAYED_RELEASE_TABLET | Freq: Every day | ORAL | 5 refills | Status: DC

## 2019-10-17 MED ORDER — BONJESTA 20-20 MG PO TBCR: 1.0000 | EXTENDED_RELEASE_TABLET | Freq: Two times a day (BID) | ORAL | 4 refills | Status: DC

## 2019-10-17 NOTE — Addendum Note (Signed)
Addended by: Brooke Dare on: 10/17/2019 02:54 PM   Modules accepted: Orders

## 2019-10-17 NOTE — Progress Notes (Signed)
Subjective:    Natalie Ray is a 39 y.o. female who presents for evaluation of amenorrhea. She believes she could be pregnant. Pregnancy is desired. Sexual Activity: single partner, contraception: none. Current symptoms also include: nausea. Last period was normal.   Patient's last menstrual period was 08/09/2019 (exact date). The following portions of the patient's history were reviewed and updated as appropriate: allergies, current medications, past family history, past medical history, past social history, past surgical history and problem list.  Review of Systems Pertinent items are noted in HPI.     Objective:    BP (!) 106/54   Pulse 77   Ht 5\' 8"  (1.727 m)   Wt 222 lb 5 oz (100.8 kg)   LMP 08/09/2019 (Exact Date)   BMI 33.80 kg/m  General: alert, cooperative, appears stated age and no acute distress    Lab Review Urine HCG: positive    Patient Name: Natalie Ray DOB: [birth year] MRN: 1981 ULTRASOUND REPORT  Location: [facility] Date of Service: 10/17/2019   Indications:dating Findings:  12/17/2019 intrauterine pregnancy is visualized with a CRL consistent with [redacted]w[redacted]d gestation, giving an (U/S) EDD of 05/14/2020.   FHR: 177 BPM CRL measurement: 31.6 mm Yolk sac is visualized and appears normal and early anatomy is normal. Amnion: visualized and appears normal   Right Ovary is normal in appearance. Left Ovary is normal appearance. Corpus luteal cyst:  Right ovary Survey of the adnexa demonstrates no adnexal masses. There is no free peritoneal fluid in the cul de sac.  Impression: 1. [redacted]w[redacted]d Viable Singleton Intrauterine pregnancy by U/S. 2. (U/S) EDD is 05/14/2020.  Recommendations: 1.Clinical correlation with the patient's History and Physical Exam.  Jenine M. 05/16/2020     RDM   Assessment:    Absence of menstruation.     Plan:  Positive: EDC: 05/15/20. Briefly discussed pre-natal care options.Midwifery vs md care. Pt plans to be midiwfe pt.  Encouraged well-balanced diet, plenty of rest when needed, pre-natal vitamins daily and walking for exercise. Discussed self-help for nausea, avoiding OTC medications until consulting provider or pharmacist, other than Tylenol as needed, minimal caffeine (1-2 cups daily) and avoiding alcohol. She will schedule her nurse visit this week and her initial NOB visit 2 wks with 14/1/21 . Feel free to call with any questions. Orders placed for [place].   Pattricia Boss, CNM

## 2019-10-17 NOTE — Patient Instructions (Signed)

## 2019-10-18 ENCOUNTER — Telehealth: Payer: Self-pay

## 2019-10-18 NOTE — Telephone Encounter (Signed)
Mychart message sent to patient.

## 2019-10-20 ENCOUNTER — Ambulatory Visit (INDEPENDENT_AMBULATORY_CARE_PROVIDER_SITE_OTHER): Payer: 59 | Admitting: Certified Nurse Midwife

## 2019-10-20 ENCOUNTER — Other Ambulatory Visit: Payer: Self-pay

## 2019-10-20 VITALS — BP 102/70 | HR 79 | Ht 68.0 in | Wt 223.7 lb

## 2019-10-20 DIAGNOSIS — Z0283 Encounter for blood-alcohol and blood-drug test: Secondary | ICD-10-CM

## 2019-10-20 DIAGNOSIS — Z113 Encounter for screening for infections with a predominantly sexual mode of transmission: Secondary | ICD-10-CM

## 2019-10-20 DIAGNOSIS — R638 Other symptoms and signs concerning food and fluid intake: Secondary | ICD-10-CM

## 2019-10-20 DIAGNOSIS — Z3491 Encounter for supervision of normal pregnancy, unspecified, first trimester: Secondary | ICD-10-CM

## 2019-10-20 NOTE — Progress Notes (Signed)
      Natalie Ray presents for NOB nurse intake visit. Pregnancy confirmation done at [facility], 10/17/2019,  [name], CNM.  G 2.  P 1001.  LMP 08/09/2019.  EDD 05/15/2020.  Ga [redacted]w[redacted]d. Pregnancy education material explained and given. 1 cats in the home.  NOB labs ordered. BMI greater than 30. TSH/HbgA1c ordered. HIV and drug screen explained and ordered. Genetic screening discussed. Genetic testing; Unsure. Pt to discuss genetic testing with provider. PNV encouraged. Pt to follow up with provider in 2 weeks for NOB physical.  FMLA form reviewed and signed by pt.

## 2019-10-20 NOTE — Patient Instructions (Signed)
WHAT OB PATIENTS CAN EXPECT   Confirmation of pregnancy and ultrasound ordered if medically indicated-[redacted] weeks gestation  New OB (NOB) intake with nurse and New OB (NOB) labs- [redacted] weeks gestation  New OB (NOB) physical examination with provider- 11/[redacted] weeks gestation  Flu vaccine-[redacted] weeks gestation  Anatomy scan-[redacted] weeks gestation  Glucose tolerance test, blood work to test for anemia, T-dap vaccine-[redacted] weeks gestation  Vaginal swabs/cultures-STD/Group B strep-[redacted] weeks gestation  Appointments every 4 weeks until 28 weeks  Every 2 weeks from 28 weeks until 36 weeks  Weekly visits from 36 weeks until delivery  Morning Sickness  Morning sickness is when you feel sick to your stomach (nauseous) during pregnancy. You may feel sick to your stomach and throw up (vomit). You may feel sick in the morning, but you can feel this way at any time of day. Some women feel very sick to their stomach and cannot stop throwing up (hyperemesis gravidarum). Follow these instructions at home: Medicines  Take over-the-counter and prescription medicines only as told by your doctor. Do not take any medicines until you talk with your doctor about them first.  Taking multivitamins before getting pregnant can stop or lessen the harshness of morning sickness. Eating and drinking  Eat dry toast or crackers before getting out of bed.  Eat 5 or 6 small meals a day.  Eat dry and bland foods like rice and baked potatoes.  Do not eat greasy, fatty, or spicy foods.  Have someone cook for you if the smell of food causes you to feel sick or throw up.  If you feel sick to your stomach after taking prenatal vitamins, take them at night or with a snack.  Eat protein when you need a snack. Nuts, yogurt, and cheese are good choices.  Drink fluids throughout the day.  Try ginger ale made with real ginger, ginger tea made from fresh grated ginger, or ginger candies. General instructions  Do not use any products  that have nicotine or tobacco in them, such as cigarettes and e-cigarettes. If you need help quitting, ask your doctor.  Use an air purifier to keep the air in your house free of smells.  Get lots of fresh air.  Try to avoid smells that make you feel sick.  Try: ? Wearing a bracelet that is used for seasickness (acupressure wristband). ? Going to a doctor who puts thin needles into certain body points (acupuncture) to improve how you feel. Contact a doctor if:  You need medicine to feel better.  You feel dizzy or light-headed.  You are losing weight. Get help right away if:  You feel very sick to your stomach and cannot stop throwing up.  You pass out (faint).  You have very bad pain in your belly. Summary  Morning sickness is when you feel sick to your stomach (nauseous) during pregnancy.  You may feel sick in the morning, but you can feel this way at any time of day.  Making some changes to what you eat may help your symptoms go away. This information is not intended to replace advice given to you by your health care provider. Make sure you discuss any questions you have with your health care provider. Document Revised: 05/14/2017 Document Reviewed: 07/02/2016 Elsevier Patient Education  2020 [employer]. How a Baby Grows During Pregnancy  Pregnancy begins when a female's sperm enters a female's egg (fertilization). Fertilization usually happens in one of the tubes (fallopian tubes) that connect the ovaries to the  womb (uterus). The fertilized egg moves down the fallopian tube to the uterus. Once it reaches the uterus, it implants into the lining of the uterus and begins to grow. For the first 10 weeks, the fertilized egg is called an embryo. After 10 weeks, it is called a fetus. As the fetus continues to grow, it receives oxygen and nutrients through tissue (placenta) that grows to support the developing baby. The placenta is the life support system for the baby. It provides  oxygen and nutrition and removes waste. Learning as much as you can about your pregnancy and how your baby is developing can help you enjoy the experience. It can also make you aware of when there might be a problem and when to ask questions. How long does a typical pregnancy last? A pregnancy usually lasts 280 days, or about 40 weeks. Pregnancy is divided into three periods of growth, also called trimesters:  First trimester: 0-12 weeks.  Second trimester: 13-27 weeks.  Third trimester: 28-40 weeks. The day when your baby is ready to be born (full term) is your estimated date of delivery. How does my baby develop month by month? First month  The fertilized egg attaches to the inside of the uterus.  Some cells will form the placenta. Others will form the fetus.  The arms, legs, brain, spinal cord, lungs, and heart begin to develop.  At the end of the first month, the heart begins to beat. Second month  The bones, inner ear, eyelids, hands, and feet form.  The genitals develop.  By the end of 8 weeks, all major organs are developing. Third month  All of the internal organs are forming.  Teeth develop below the gums.  Bones and muscles begin to grow. The spine can flex.  The skin is transparent.  Fingernails and toenails begin to form.  Arms and legs continue to grow longer, and hands and feet develop.  The fetus is about 3 inches (7.6 cm) long. Fourth month  The placenta is completely formed.  The external sex organs, neck, outer ear, eyebrows, eyelids, and fingernails are formed.  The fetus can hear, swallow, and move its arms and legs.  The kidneys begin to produce urine.  The skin is covered with a white, waxy coating (vernix) and very fine hair (lanugo). Fifth month  The fetus moves around more and can be felt for the first time (quickening).  The fetus starts to sleep and wake up and may begin to suck its finger.  The nails grow to the end of the  fingers.  The organ in the digestive system that makes bile (gallbladder) functions and helps to digest nutrients.  If your baby is a girl, eggs are present in her ovaries. If your baby is a boy, testicles start to move down into his scrotum. Sixth month  The lungs are formed.  The eyes open. The brain continues to develop.  Your baby has fingerprints and toe prints. Your baby's hair grows thicker.  At the end of the second trimester, the fetus is about 9 inches (22.9 cm) long. Seventh month  The fetus kicks and stretches.  The eyes are developed enough to sense changes in light.  The hands can make a grasping motion.  The fetus responds to sound. Eighth month  All organs and body systems are fully developed and functioning.  Bones harden, and taste buds develop. The fetus may hiccup.  Certain areas of the brain are still developing. The skull remains soft.   Ninth month  The fetus gains about  lb (0.23 kg) each week.  The lungs are fully developed.  Patterns of sleep develop.  The fetus's head typically moves into a head-down position (vertex) in the uterus to prepare for birth.  The fetus weighs 6-9 lb (2.72-4.08 kg) and is 19-20 inches (48.26-50.8 cm) long. What can I do to have a healthy pregnancy and help my baby develop? General instructions  Take prenatal vitamins as directed by your health care provider. These include vitamins such as folic acid, iron, calcium, and vitamin D. They are important for healthy development.  Take medicines only as directed by your health care provider. Read labels and ask a pharmacist or your health care provider whether over-the-counter medicines, supplements, and prescription drugs are safe to take during pregnancy.  Keep all follow-up visits as directed by your health care provider. This is important. Follow-up visits include prenatal care and screening tests. How do I know if my baby is developing well? At each prenatal visit,  your health care provider will do several different tests to check on your health and keep track of your baby's development. These include:  Fundal height and position. ? Your health care provider will measure your growing belly from your pubic bone to the top of the uterus using a tape measure. ? Your health care provider will also feel your belly to determine your baby's position.  Heartbeat. ? An ultrasound in the first trimester can confirm pregnancy and show a heartbeat, depending on how far along you are. ? Your health care provider will check your baby's heart rate at every prenatal visit.  Second trimester ultrasound. ? This ultrasound checks your baby's development. It also may show your baby's gender. What should I do if I have concerns about my baby's development? Always talk with your health care provider about any concerns that you may have about your pregnancy and your baby. Summary  A pregnancy usually lasts 280 days, or about 40 weeks. Pregnancy is divided into three periods of growth, also called trimesters.  Your health care provider will monitor your baby's growth and development throughout your pregnancy.  Follow your health care provider's recommendations about taking prenatal vitamins and medicines during your pregnancy.  Talk with your health care provider if you have any concerns about your pregnancy or your developing baby. This information is not intended to replace advice given to you by your health care provider. Make sure you discuss any questions you have with your health care provider. Document Revised: 09/22/2018 Document Reviewed: 04/14/2017 Elsevier Patient Education  2020 [place] of Pregnancy  The first trimester of pregnancy is from week 1 until the end of week 13 (months 1 through 3). During this time, your baby will begin to develop inside you. At 6-8 weeks, the eyes and face are formed, and the heartbeat can be seen on  ultrasound. At the end of 12 weeks, all the baby's organs are formed. Prenatal care is all the medical care you receive before the birth of your baby. Make sure you get good prenatal care and follow all of your doctor's instructions. Follow these instructions at home: Medicines  Take over-the-counter and prescription medicines only as told by your doctor. Some medicines are safe and some medicines are not safe during pregnancy.  Take a prenatal vitamin that contains at least 600 micrograms (mcg) of folic acid.  If you have trouble pooping (constipation), take medicine that will make your stool soft (stool  softener) if your doctor approves. Eating and drinking   Eat regular, healthy meals.  Your doctor will tell you the amount of weight gain that is right for you.  Avoid raw meat and uncooked cheese.  If you feel sick to your stomach (nauseous) or throw up (vomit): ? Eat 4 or 5 small meals a day instead of 3 large meals. ? Try eating a few soda crackers. ? Drink liquids between meals instead of during meals.  To prevent constipation: ? Eat foods that are high in fiber, like fresh fruits and vegetables, whole grains, and beans. ? Drink enough fluids to keep your pee (urine) clear or pale yellow. Activity  Exercise only as told by your doctor. Stop exercising if you have cramps or pain in your lower belly (abdomen) or low back.  Do not exercise if it is too hot, too humid, or if you are in a place of great height (high altitude).  Try to avoid standing for long periods of time. Move your legs often if you must stand in one place for a long time.  Avoid heavy lifting.  Wear low-heeled shoes. Sit and stand up straight.  You can have sex unless your doctor tells you not to. Relieving pain and discomfort  Wear a good support bra if your breasts are sore.  Take warm water baths (sitz baths) to soothe pain or discomfort caused by hemorrhoids. Use hemorrhoid cream if your doctor says  it is okay.  Rest with your legs raised if you have leg cramps or low back pain.  If you have puffy, bulging veins (varicose veins) in your legs: ? Wear support hose or compression stockings as told by your doctor. ? Raise (elevate) your feet for 15 minutes, 3-4 times a day. ? Limit salt in your food. Prenatal care  Schedule your prenatal visits by the twelfth week of pregnancy.  Write down your questions. Take them to your prenatal visits.  Keep all your prenatal visits as told by your doctor. This is important. Safety  Wear your seat belt at all times when driving.  Make a list of emergency phone numbers. The list should include numbers for family, friends, the hospital, and police and fire departments. General instructions  Ask your doctor for a referral to a local prenatal class. Begin classes no later than at the start of month 6 of your pregnancy.  Ask for help if you need counseling or if you need help with nutrition. Your doctor can give you advice or tell you where to go for help.  Do not use hot tubs, steam rooms, or saunas.  Do not douche or use tampons or scented sanitary pads.  Do not cross your legs for long periods of time.  Avoid all herbs and alcohol. Avoid drugs that are not approved by your doctor.  Do not use any tobacco products, including cigarettes, chewing tobacco, and electronic cigarettes. If you need help quitting, ask your doctor. You may get counseling or other support to help you quit.  Avoid cat litter boxes and soil used by cats. These carry germs that can cause birth defects in the baby and can cause a loss of your baby (miscarriage) or stillbirth.  Visit your dentist. At home, brush your teeth with a soft toothbrush. Be gentle when you floss. Contact a doctor if:  You are dizzy.  You have mild cramps or pressure in your lower belly.  You have a nagging pain in your belly area.  You  continue to feel sick to your stomach, you throw up, or  you have watery poop (diarrhea).  You have a bad smelling fluid coming from your vagina.  You have pain when you pee (urinate).  You have increased puffiness (swelling) in your face, hands, legs, or ankles. Get help right away if:  You have a fever.  You are leaking fluid from your vagina.  You have spotting or bleeding from your vagina.  You have very bad belly cramping or pain.  You gain or lose weight rapidly.  You throw up blood. It may look like coffee grounds.  You are around people who have [place] measles, fifth disease, or chickenpox.  You have a very bad headache.  You have shortness of breath.  You have any kind of trauma, such as from a fall or a car accident. Summary  The first trimester of pregnancy is from week 1 until the end of week 13 (months 1 through 3).  To take care of yourself and your unborn baby, you will need to eat healthy meals, take medicines only if your doctor tells you to do so, and do activities that are safe for you and your baby.  Keep all follow-up visits as told by your doctor. This is important as your doctor will have to ensure that your baby is healthy and growing well. This information is not intended to replace advice given to you by your health care provider. Make sure you discuss any questions you have with your health care provider. Document Revised: 09/22/2018 Document Reviewed: 06/09/2016 Elsevier Patient Education  2020 [employer]. Commonly Asked Questions During Pregnancy  Cats: A parasite can be excreted in cat feces.  To avoid exposure you need to have another person empty the little box.  If you must empty the litter box you will need to wear gloves.  Wash your hands after handling your cat.  This parasite can also be found in raw or undercooked meat so this should also be avoided.  Colds, Sore Throats, Flu: Please check your medication sheet to see what you can take for symptoms.  If your symptoms are unrelieved by these  medications please call the office.  Dental Work: Most any dental work [employer] banker[employer] corporate recommends is permitted.  X-rays should only be taken during the first trimester if absolutely necessary.  Your abdomen should be shielded with a lead apron during all x-rays.  Please notify your provider prior to receiving any x-rays.  Novocaine is fine; gas is not recommended.  If your dentist requires a note from [place] prior to dental work please call the office and we will provide one for you.  Exercise: Exercise is an important part of staying healthy during your pregnancy.  You may continue most exercises you were accustomed to prior to pregnancy.  Later in your pregnancy you will most likely notice you have difficulty with activities requiring balance like riding a bicycle.  It is important that you listen to your body and avoid activities that put you at a higher risk of falling.  Adequate rest and staying well hydrated are a must!  If you have questions about the safety of specific activities ask your provider.    Exposure to Children with illness: Try to avoid obvious exposure; report any symptoms to [place] when noted,  If you have chicken pos, red measles or mumps, you should be immune to these diseases.   Please do not take any vaccines while pregnant unless you have checked  with your OB provider.  Fetal Movement: After 28 weeks we recommend you do "kick counts" twice daily.  Lie or sit down in a calm quiet environment and count your baby movements "kicks".  You should feel your baby at least 10 times per hour.  If you have not felt 10 kicks within the first hour get up, walk around and have something sweet to eat or drink then repeat for an additional hour.  If count remains less than 10 per hour notify your provider.  Fumigating: Follow your pest control agent's advice as to how long to stay out of your home.  Ventilate the area well before re-entering.  Hemorrhoids:   Most over-the-counter preparations can be used  during pregnancy.  Check your medication to see what is safe to use.  It is important to use a stool softener or fiber in your diet and to drink lots of liquids.  If hemorrhoids seem to be getting worse please call the office.   Hot Tubs:  Hot tubs Jacuzzis and saunas are not recommended while pregnant.  These increase your internal body temperature and should be avoided.  Intercourse:  Sexual intercourse is safe during pregnancy as long as you are comfortable, unless otherwise advised by your provider.  Spotting may occur after intercourse; report any bright red bleeding that is heavier than spotting.  Labor:  If you know that you are in labor, please go to the hospital.  If you are unsure, please call the office and let us help you decide what to do.  Lifting, straining, etc:  If your job requires heavy lifting or straining please check with your provider for any limitations.  Generally, you should not lift items heavier than that you can lift simply with your hands and arms (no back muscles)  Painting:  Paint fumes do not harm your pregnancy, but may make you ill and should be avoided if possible.  Latex or water based paints have less odor than oils.  Use adequate ventilation while painting.  Permanents & Hair Color:  Chemicals in hair dyes are not recommended as they cause increase hair dryness which can increase hair loss during pregnancy.  " Highlighting" and permanents are allowed.  Dye may be absorbed differently and permanents may not hold as well during pregnancy.  Sunbathing:  Use a sunscreen, as skin burns easily during pregnancy.  Drink plenty of fluids; avoid over heating.  Tanning Beds:  Because their possible side effects are still unknown, tanning beds are not recommended.  Ultrasound Scans:  Routine ultrasounds are performed at approximately 20 weeks.  You will be able to see your baby's general anatomy an if you would like to know the gender this can usually be determined as  well.  If it is questionable when you conceived you may also receive an ultrasound early in your pregnancy for dating purposes.  Otherwise ultrasound exams are not routinely performed unless there is a medical necessity.  Although you can request a scan we ask that you pay for it when conducted because insurance does not cover " patient request" scans.  Work: If your pregnancy proceeds without complications you may work until your due date, unless your physician or employer advises otherwise.  Round Ligament Pain/Pelvic Discomfort:  Sharp, shooting pains not associated with bleeding are fairly common, usually occurring in the second trimester of pregnancy.  They tend to be worse when standing up or when you remain standing for long periods of time.  These are  the result of pressure of certain pelvic ligaments called "round ligaments".  Rest, Tylenol and heat seem to be the most effective relief.  As the womb and fetus grow, they rise out of the pelvis and the discomfort improves.  Please notify the office if your pain seems different than that described.  It may represent a more serious condition.  Common Medications Safe in Pregnancy  Acne:      Constipation:  Benzoyl Peroxide     Colace  Clindamycin      Dulcolax Suppository  Topica Erythromycin     Fibercon  Salicylic Acid      Metamucil         Miralax AVOID:        Senakot   Accutane    Cough:  Retin-A       Cough Drops  Tetracycline      Phenergan w/ Codeine if Rx  Minocycline      Robitussin (Plain & DM)  Antibiotics:     Crabs/Lice:  Ceclor       RID  Cephalosporins    AVOID:  E-Mycins      Kwell  Keflex  Macrobid/Macrodantin   Diarrhea:  Penicillin      Kao-Pectate  Zithromax      Imodium AD         PUSH FLUIDS AVOID:       Cipro     Fever:  Tetracycline      Tylenol (Regular or Extra  Minocycline       Strength)  Levaquin      Extra Strength-Do not          Exceed 8 tabs/24 hrs Caffeine:        <294m/day (equiv. To 1 cup  of coffee or  approx. 3 12 oz sodas)         Gas: Cold/Hayfever:       Gas-X  Benadryl      Mylicon  Claritin       Phazyme  **Claritin-D        Chlor-Trimeton    Headaches:  Dimetapp      ASA-Free Excedrin  Drixoral-Non-Drowsy     Cold Compress  Mucinex (Guaifenasin)     Tylenol (Regular or Extra  Sudafed/Sudafed-12 Hour     Strength)  **Sudafed PE Pseudoephedrine   Tylenol Cold & Sinus     Vicks Vapor Rub  Zyrtec  **AVOID if Problems With Blood Pressure         Heartburn: Avoid lying down for at least 1 hour after meals  Aciphex      Maalox     Rash:  Milk of Magnesia     Benadryl    Mylanta       1% Hydrocortisone Cream  Pepcid  Pepcid Complete   Sleep Aids:  Prevacid      Ambien   Prilosec       Benadryl  Rolaids       Chamomile Tea  Tums (Limit 4/day)     Unisom  Zantac       Tylenol PM         Warm milk-add vanilla or  Hemorrhoids:       Sugar for taste  Anusol/Anusol H.C.  (RX: Analapram 2.5%)  Sugar Substitutes:  Hydrocortisone OTC     Ok in moderation  Preparation H      Tucks        Vaseline lotion applied to tissue with wiping    Herpes:  Throat:  Acyclovir      Oragel  Famvir  Valtrex     Vaccines:         Flu Shot Leg Cramps:       *Gardasil  Benadryl      Hepatitis A         Hepatitis B Nasal Spray:       Pneumovax  Saline Nasal Spray     Polio Booster         Tetanus Nausea:       Tuberculosis test or PPD  Vitamin B6 25 mg TID   AVOID:    Dramamine      *Gardasil  Emetrol       Live Poliovirus  Ginger Root 250 mg QID    MMR (measles, mumps &  High Complex Carbs @ Bedtime    rebella)  Sea Bands-Accupressure    Varicella (Chickenpox)  Unisom 1/2 tab TID     *No known complications           If received before Pain:         Known pregnancy;   Darvocet       Resume series after  Lortab        Delivery  Percocet    Yeast:   Tramadol      Femstat  Tylenol  3      Gyne-lotrimin  Ultram       Monistat  Vicodin           MISC:         All Sunscreens           Hair Coloring/highlights          Insect Repellant's          (Including DEET)         Mystic Tans

## 2019-10-21 LAB — URINALYSIS, ROUTINE W REFLEX MICROSCOPIC
Bilirubin, UA: NEGATIVE
Glucose, UA: NEGATIVE
Ketones, UA: NEGATIVE
Leukocytes,UA: NEGATIVE
Nitrite, UA: NEGATIVE
Protein,UA: NEGATIVE
RBC, UA: NEGATIVE
Specific Gravity, UA: >=1.03 — AB (ref 1.005–1.030)
Urobilinogen, Ur: 0.2 mg/dL (ref 0.2–1.0)
pH, UA: 5.5 (ref 5.0–7.5)

## 2019-10-21 LAB — HEMOGLOBIN A1C
Est. average glucose Bld gHb Est-mCnc: 108 mg/dL
Hgb A1c MFr Bld: 5.4 % (ref 4.8–5.6)

## 2019-10-21 LAB — TOXOPLASMA ANTIBODIES- IGG AND  IGM
Toxoplasma Antibody- IgM: <3 AU/mL (ref 0.0–7.9)
Toxoplasma IgG Ratio: <3 IU/mL (ref 0.0–7.1)

## 2019-10-21 LAB — DRUG PROFILE, UR, 9 DRUGS (LABCORP)
Amphetamines, Urine: NEGATIVE ng/mL
Barbiturate Quant, Ur: NEGATIVE ng/mL
Benzodiazepine Quant, Ur: NEGATIVE ng/mL
Cannabinoid Quant, Ur: NEGATIVE ng/mL
Cocaine (Metab.): NEGATIVE ng/mL
Methadone Screen, Urine: NEGATIVE ng/mL
Opiate Quant, Ur: NEGATIVE ng/mL
PCP Quant, Ur: NEGATIVE ng/mL
Propoxyphene: NEGATIVE ng/mL

## 2019-10-21 LAB — ANTIBODY SCREEN: Antibody Screen: NEGATIVE

## 2019-10-21 LAB — HEPATITIS B SURFACE ANTIGEN: Hepatitis B Surface Ag: NEGATIVE

## 2019-10-21 LAB — NICOTINE SCREEN, URINE: Cotinine Ql Scrn, Ur: NEGATIVE ng/mL

## 2019-10-21 LAB — RUBELLA SCREEN: Rubella Antibodies, IGG: 0.95 index — ABNORMAL LOW (ref >0.99)

## 2019-10-21 LAB — HIV ANTIBODY (ROUTINE TESTING W REFLEX): HIV Screen 4th Generation wRfx: NONREACTIVE

## 2019-10-21 LAB — ABO AND RH: Rh Factor: POSITIVE

## 2019-10-21 LAB — TSH: TSH: 0.741 u[IU]/mL (ref 0.450–4.500)

## 2019-10-21 LAB — RPR: RPR Ser Ql: NONREACTIVE

## 2019-10-21 LAB — VARICELLA ZOSTER ANTIBODY, IGG: Varicella zoster IgG: >4000 index (ref >165)

## 2019-10-22 LAB — URINE CULTURE, OB REFLEX

## 2019-10-22 LAB — CULTURE, OB URINE

## 2019-10-23 LAB — GC/CHLAMYDIA PROBE AMP
Chlamydia trachomatis, NAA: NEGATIVE
Neisseria Gonorrhoeae by PCR: NEGATIVE

## 2019-11-08 ENCOUNTER — Encounter: Payer: Self-pay | Admitting: Certified Nurse Midwife

## 2019-11-08 ENCOUNTER — Other Ambulatory Visit: Payer: Self-pay

## 2019-11-08 ENCOUNTER — Ambulatory Visit: Payer: 59 | Admitting: Certified Nurse Midwife

## 2019-11-08 VITALS — BP 104/74 | HR 81 | Wt 222.2 lb

## 2019-11-08 DIAGNOSIS — Z3A13 13 weeks gestation of pregnancy: Secondary | ICD-10-CM

## 2019-11-08 LAB — POCT URINALYSIS DIPSTICK OB
Bilirubin, UA: NEGATIVE
Blood, UA: NEGATIVE
Glucose, UA: NEGATIVE
Ketones, UA: NEGATIVE
Leukocytes, UA: NEGATIVE
Nitrite, UA: NEGATIVE
POC,PROTEIN,UA: NEGATIVE
Spec Grav, UA: >=1.03 — AB (ref 1.010–1.025)
Urobilinogen, UA: 0.2 E.U./dL
pH, UA: 5 (ref 5.0–8.0)

## 2019-11-08 NOTE — Patient Instructions (Signed)
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
## 2019-11-09 LAB — CBC
Hematocrit: 39.7 % (ref 34.0–46.6)
Hemoglobin: 12.9 g/dL (ref 11.1–15.9)
MCH: 29.3 pg (ref 26.6–33.0)
MCHC: 32.5 g/dL (ref 31.5–35.7)
MCV: 90 fL (ref 79–97)
Platelets: 241 10*3/uL (ref 150–450)
RBC: 4.41 x10E6/uL (ref 3.77–5.28)
RDW: 13 % (ref 11.7–15.4)
WBC: 12.6 10*3/uL — ABNORMAL HIGH (ref 3.4–10.8)

## 2019-12-07 ENCOUNTER — Other Ambulatory Visit: Payer: Self-pay

## 2019-12-07 ENCOUNTER — Ambulatory Visit: Payer: 59 | Admitting: Certified Nurse Midwife

## 2019-12-07 VITALS — BP 118/78 | HR 79 | Wt 224.2 lb

## 2019-12-07 DIAGNOSIS — Z3402 Encounter for supervision of normal first pregnancy, second trimester: Secondary | ICD-10-CM

## 2019-12-07 DIAGNOSIS — Z3A17 17 weeks gestation of pregnancy: Secondary | ICD-10-CM

## 2019-12-07 DIAGNOSIS — K219 Gastro-esophageal reflux disease without esophagitis: Secondary | ICD-10-CM

## 2019-12-07 DIAGNOSIS — Z3689 Encounter for other specified antenatal screening: Secondary | ICD-10-CM

## 2019-12-07 DIAGNOSIS — O99619 Diseases of the digestive system complicating pregnancy, unspecified trimester: Secondary | ICD-10-CM

## 2019-12-07 DIAGNOSIS — O99210 Obesity complicating pregnancy, unspecified trimester: Secondary | ICD-10-CM

## 2019-12-07 DIAGNOSIS — Z8759 Personal history of other complications of pregnancy, childbirth and the puerperium: Secondary | ICD-10-CM

## 2019-12-07 LAB — POCT URINALYSIS DIPSTICK OB
Bilirubin, UA: NEGATIVE
Blood, UA: NEGATIVE
Glucose, UA: NEGATIVE
Ketones, UA: NEGATIVE
Leukocytes, UA: NEGATIVE
Nitrite, UA: NEGATIVE
POC,PROTEIN,UA: NEGATIVE
Spec Grav, UA: 1.015 (ref 1.010–1.025)
Urobilinogen, UA: 0.2 E.U./dL
pH, UA: 5 (ref 5.0–8.0)

## 2019-12-07 MED ORDER — PANTOPRAZOLE SODIUM 20 MG PO TBEC: 20.0000 mg | DELAYED_RELEASE_TABLET | Freq: Every day | ORAL | 2 refills | Status: DC

## 2019-12-07 MED ORDER — ASPIRIN EC 81 MG PO TBEC
81.0000 mg | DELAYED_RELEASE_TABLET | Freq: Every day | ORAL | 2 refills | Status: DC
Start: 2019-12-07 — End: 2020-05-17

## 2019-12-07 NOTE — Patient Instructions (Addendum)
WHAT OB PATIENTS CAN EXPECT   Confirmation of pregnancy and ultrasound ordered if medically indicated-[redacted] weeks gestation  New OB (NOB) intake with nurse and New OB (NOB) labs- [redacted] weeks gestation  New OB (NOB) physical examination with provider- 11/[redacted] weeks gestation  Flu vaccine-[redacted] weeks gestation  Anatomy scan-[redacted] weeks gestation  Glucose tolerance test, blood work to test for anemia, T-dap vaccine-[redacted] weeks gestation  Vaginal swabs/cultures-STD/Group B strep-[redacted] weeks gestation  Appointments every 4 weeks until 28 weeks  Every 2 weeks from 28 weeks until 36 weeks  Weekly visits from 36 weeks until delivery    Second Trimester of Pregnancy  The second trimester is from week 14 through week 27 (month 4 through 6). This is often the time in pregnancy that you feel your best. Often times, morning sickness has lessened or quit. You may have more energy, and you may get hungry more often. Your unborn baby is growing rapidly. At the end of the sixth month, he or she is about 9 inches long and weighs about 1 pounds. You will likely feel the baby move between 18 and 20 weeks of pregnancy. Follow these instructions at home: Medicines  Take over-the-counter and prescription medicines only as told by your doctor. Some medicines are safe and some medicines are not safe during pregnancy.  Take a prenatal vitamin that contains at least 600 micrograms (mcg) of folic acid.  If you have trouble pooping (constipation), take medicine that will make your stool soft (stool softener) if your doctor approves. Eating and drinking   Eat regular, healthy meals.  Avoid raw meat and uncooked cheese.  If you get low calcium from the food you eat, talk to your doctor about taking a daily calcium supplement.  Avoid foods that are high in fat and sugars, such as fried and sweet foods.  If you feel sick to your stomach (nauseous) or throw up (vomit): ? Eat 4 or 5 small meals a day instead of 3 large  meals. ? Try eating a few soda crackers. ? Drink liquids between meals instead of during meals.  To prevent constipation: ? Eat foods that are high in fiber, like fresh fruits and vegetables, whole grains, and beans. ? Drink enough fluids to keep your pee (urine) clear or pale yellow. Activity  Exercise only as told by your doctor. Stop exercising if you start to have cramps.  Do not exercise if it is too hot, too humid, or if you are in a place of great height (high altitude).  Avoid heavy lifting.  Wear low-heeled shoes. Sit and stand up straight.  You can continue to have sex unless your doctor tells you not to. Relieving pain and discomfort  Wear a good support bra if your breasts are tender.  Take warm water baths (sitz baths) to soothe pain or discomfort caused by hemorrhoids. Use hemorrhoid cream if your doctor approves.  Rest with your legs raised if you have leg cramps or low back pain.  If you develop puffy, bulging veins (varicose veins) in your legs: ? Wear support hose or compression stockings as told by your doctor. ? Raise (elevate) your feet for 15 minutes, 3-4 times a day. ? Limit salt in your food. Prenatal care  Write down your questions. Take them to your prenatal visits.  Keep all your prenatal visits as told by your doctor. This is important. Safety  Wear your seat belt when driving.  Make a list of emergency phone numbers, including numbers for family,  friends, the hospital, and police and fire departments. General instructions  Ask your doctor about the right foods to eat or for help finding a counselor, if you need these services.  Ask your doctor about local prenatal classes. Begin classes before month 6 of your pregnancy.  Do not use hot tubs, steam rooms, or saunas.  Do not douche or use tampons or scented sanitary pads.  Do not cross your legs for long periods of time.  Visit your dentist if you have not done so. Use a soft toothbrush  to brush your teeth. Floss gently.  Avoid all smoking, herbs, and alcohol. Avoid drugs that are not approved by your doctor.  Do not use any products that contain nicotine or tobacco, such as cigarettes and e-cigarettes. If you need help quitting, ask your doctor.  Avoid cat litter boxes and soil used by cats. These carry germs that can cause birth defects in the baby and can cause a loss of your baby (miscarriage) or stillbirth. Contact a doctor if:  You have mild cramps or pressure in your lower belly.  You have pain when you pee (urinate).  You have bad smelling fluid coming from your vagina.  You continue to feel sick to your stomach (nauseous), throw up (vomit), or have watery poop (diarrhea).  You have a nagging pain in your belly area.  You feel dizzy. Get help right away if:  You have a fever.  You are leaking fluid from your vagina.  You have spotting or bleeding from your vagina.  You have severe belly cramping or pain.  You lose or gain weight rapidly.  You have trouble catching your breath and have chest pain.  You notice sudden or extreme puffiness (swelling) of your face, hands, ankles, feet, or legs.  You have not felt the baby move in over an hour.  You have severe headaches that do not go away when you take medicine.  You have trouble seeing. Summary  The second trimester is from week 14 through week 27 (months 4 through 6). This is often the time in pregnancy that you feel your best.  To take care of yourself and your unborn baby, you will need to eat healthy meals, take medicines only if your doctor tells you to do so, and do activities that are safe for you and your baby.  Call your doctor if you get sick or if you notice anything unusual about your pregnancy. Also, call your doctor if you need help with the right food to eat, or if you want to know what activities are safe for you. This information is not intended to replace advice given to you by  your health care provider. Make sure you discuss any questions you have with your health care provider. Document Revised: 09/23/2018 Document Reviewed: 07/07/2016 Elsevier Patient Education  [place].   Back Pain in Pregnancy Back pain during pregnancy is common. Back pain may be caused by several factors that are related to changes during your pregnancy. Follow these instructions at home: Managing pain, stiffness, and swelling      If directed, for sudden (acute) back pain, put ice on the painful area. ? Put ice in a plastic bag. ? Place a towel between your skin and the bag. ? Leave the ice on for 20 minutes, 2-3 times per day.  If directed, apply heat to the affected area before you exercise. Use the heat source that your health care provider recommends, such as a  moist heat pack or a heating pad. ? Place a towel between your skin and the heat source. ? Leave the heat on for 20-30 minutes. ? Remove the heat if your skin turns bright red. This is especially important if you are unable to feel pain, heat, or cold. You may have a greater risk of getting burned.  If directed, massage the affected area. Activity  Exercise as told by your health care provider. Gentle exercise is the best way to prevent or manage back pain.  Listen to your body when lifting. If lifting hurts, ask for help or bend your knees. This uses your leg muscles instead of your back muscles.  Squat down when picking up something from the floor. Do not bend over.  Only use bed rest for short periods as told by your health care provider. Bed rest should only be used for the most severe episodes of back pain. Standing, sitting, and lying down  Do not stand in one place for long periods of time.  Use good posture when sitting. Make sure your head rests over your shoulders and is not hanging forward. Use a pillow on your lower back if necessary.  Try sleeping on your side, preferably the left side, with  a pregnancy support pillow or 1-2 regular pillows between your legs. ? If you have back pain after a night's rest, your bed may be too soft. ? A firm mattress may provide more support for your back during pregnancy. General instructions  Do not wear high heels.  Eat a healthy diet. Try to gain weight within your health care provider's recommendations.  Use a maternity girdle, elastic sling, or back brace as told by your health care provider.  Take over-the-counter and prescription medicines only as told by your health care provider.  Work with a physical therapist or massage therapist to find ways to manage back pain. Acupuncture or massage therapy may be helpful.  Keep all follow-up visits as told by your health care provider. This is important. Contact a health care provider if:  Your back pain interferes with your daily activities.  You have increasing pain in other parts of your body. Get help right away if:  You develop numbness, tingling, weakness, or problems with the use of your arms or legs.  You develop severe back pain that is not controlled with medicine.  You have a change in bowel or bladder control.  You develop shortness of breath, dizziness, or you faint.  You develop nausea, vomiting, or sweating.  You have back pain that is a rhythmic, cramping pain similar to labor pains. Labor pain is usually 1-2 minutes apart, lasts for about 1 minute, and involves a bearing down feeling or pressure in your pelvis.  You have back pain and your water breaks or you have vaginal bleeding.  You have back pain or numbness that travels down your leg.  Your back pain developed after you fell.  You develop pain on one side of your back.  You see blood in your urine.  You develop skin blisters in the area of your back pain. Summary  Back pain may be caused by several factors that are related to changes during your pregnancy.  Follow instructions as told by your health  care provider for managing pain, stiffness, and swelling.  Exercise as told by your health care provider. Gentle exercise is the best way to prevent or manage back pain.  Take over-the-counter and prescription medicines only as told by  your health care provider.  Keep all follow-up visits as told by your health care provider. This is important. This information is not intended to replace advice given to you by your health care provider. Make sure you discuss any questions you have with your health care provider. Document Revised: 09/20/2018 Document Reviewed: 11/17/2017 Elsevier Patient Education  2020 [employer]   Round Ligament Pain  The round ligament is a cord of muscle and tissue that helps support the uterus. It can become a source of pain during pregnancy if it becomes stretched or twisted as the baby grows. The pain usually begins in the second trimester (13-28 weeks) of pregnancy, and it can come and go until the baby is delivered. It is not a serious problem, and it does not cause harm to the baby. Round ligament pain is usually a short, sharp, and pinching pain, but it can also be a dull, lingering, and aching pain. The pain is felt in the lower side of the abdomen or in the groin. It usually starts deep in the groin and moves up to the outside of the hip area. The pain may occur when you:  Suddenly change position, such as quickly going from a sitting to standing position.  Roll over in bed.  Cough or sneeze.  Do physical activity. Follow these instructions at home:   Watch your condition for any changes.  When the pain starts, relax. Then try any of these methods to help with the pain: ? Sitting down. ? Flexing your knees up to your abdomen. ? Lying on your side with one pillow under your abdomen and another pillow between your legs. ? Sitting in a warm bath for 15-20 minutes or until the pain goes away.  Take over-the-counter and prescription medicines only as  told by your health care provider.  Move slowly when you sit down or stand up.  Avoid long walks if they cause pain.  Stop or reduce your physical activities if they cause pain.  Keep all follow-up visits as told by your health care provider. This is important. Contact a health care provider if:  Your pain does not go away with treatment.  You feel pain in your back that you did not have before.  Your medicine is not helping. Get help right away if:  You have a fever or chills.  You develop uterine contractions.  You have vaginal bleeding.  You have nausea or vomiting.  You have diarrhea.  You have pain when you urinate. Summary  Round ligament pain is felt in the lower abdomen or groin. It is usually a short, sharp, and pinching pain. It can also be a dull, lingering, and aching pain.  This pain usually begins in the second trimester (13-28 weeks). It occurs because the uterus is stretching with the growing baby, and it is not harmful to the baby.  You may notice the pain when you suddenly change position, when you cough or sneeze, or during physical activity.  Relaxing, flexing your knees to your abdomen, lying on one side, or taking a warm bath may help to get rid of the pain.  Get help from your health care provider if the pain does not go away or if you have vaginal bleeding, nausea, vomiting, diarrhea, or painful urination. This information is not intended to replace advice given to you by your health care provider. Make sure you discuss any questions you have with your health care provider. Document Revised: 11/17/2017 Document Reviewed: 11/17/2017  Elsevier Patient Education  [employer].  Heartburn During Pregnancy  Heartburn is pain or discomfort in the throat or chest. It may cause a burning feeling. It happens when stomach acid moves up into the tube that carries food from your mouth to your stomach (esophagus). Heartburn is common during pregnancy. It  usually goes away or gets better after giving birth. Follow these instructions at home: Eating and drinking  Do not drink alcohol while you are pregnant.  Figure out which foods and beverages make you feel worse, and avoid them.  Beverages that you may want to avoid include: ? Coffee and tea (with or without caffeine). ? Energy drinks and sports drinks. ? Bubbly (carbonated) drinks or sodas. ? Citrus fruit juices.  Foods that you may want to avoid include: ? Chocolate and cocoa. ? Peppermint and mint flavorings. ? Garlic, onions, and horseradish. ? Spicy and acidic foods. These include peppers, chili powder, curry powder, vinegar, hot sauces, and barbecue sauce. ? Citrus fruits, such as oranges, lemons, and limes. ? Tomato-based foods, such as red sauce, chili, and salsa. ? Fried and fatty foods, such as donuts, french fries, potato chips, and high-fat dressings. ? High-fat meats, such as hot dogs, cold cuts, sausage, ham, and bacon. ? High-fat dairy items, such as whole milk, butter, and cheese.  Eat small meals often, instead of large meals.  Avoid drinking a lot of liquid with your meals.  Avoid eating meals during the 2-3 hours before you go to bed.  Avoid lying down right after you eat.  Do not exercise right after you eat. Medicines  Take over-the-counter and prescription medicines only as told by your doctor.  Do not take aspirin, ibuprofen, or other NSAIDs unless your doctor tells you to do that.  Your doctor may tell you to avoid medicines that have sodium bicarbonate in them. General instructions   If told, raise the head of your bed about 6 inches (15 cm). You can do this by putting blocks under the legs. Sleeping with more pillows does not help with heartburn.  Do not use any products that contain nicotine or tobacco, such as cigarettes and e-cigarettes. If you need help quitting, ask your doctor.  Wear loose-fitting clothing.  Try to lower your stress,  such as with yoga or meditation. If you need help, ask your doctor.  Stay at a healthy weight. If you are overweight, work with your doctor to safely lose weight.  Keep all follow-up visits as told by your doctor. This is important. Contact a doctor if:  You get new symptoms.  Your symptoms do not get better with treatment.  You have weight loss and you do not know why.  You have trouble swallowing.  You make loud sounds when you breathe (wheeze).  You have a cough that does not go away.  You have heartburn often for more than 2 weeks.  You feel sick to your stomach (nauseous), and this does not get better with treatment.  You are throwing up (vomiting), and this does not get better with treatment.  You have pain in your belly (abdomen). Get help right away if:  You have very bad chest pain that spreads to your arm, neck, or jaw.  You feel sweaty, dizzy, or light-headed.  You have trouble breathing.  You have pain when swallowing.  You throw up and your throw-up looks like blood or coffee grounds.  Your poop (stool) is bloody or black. This information is not intended  to replace advice given to you by your health care provider. Make sure you discuss any questions you have with your health care provider. Document Revised: 09/22/2018 Document Reviewed: 02/17/2016 Elsevier Patient Education  2020 [employer].

## 2019-12-07 NOTE — Progress Notes (Signed)
ROB-Reports increased reflux; requests prescription. Rx Protonix, see orders. Advised to take 81 mg aspirin daily to prevent pre-eclampsia; Rx Aspirin, see orders. Anticipatory guidance regarding course of prenatal care. Reviewed red flag symptoms and when to call. RTC x 3-4 weeks for ANATOMY SCAN and ROB or sooner if needed.

## 2020-01-02 ENCOUNTER — Other Ambulatory Visit: Payer: 59

## 2020-01-02 ENCOUNTER — Encounter: Payer: 59 | Admitting: Certified Nurse Midwife

## 2020-01-03 ENCOUNTER — Ambulatory Visit (INDEPENDENT_AMBULATORY_CARE_PROVIDER_SITE_OTHER): Payer: No Typology Code available for payment source

## 2020-01-03 ENCOUNTER — Encounter: Payer: Self-pay | Admitting: Certified Nurse Midwife

## 2020-01-03 ENCOUNTER — Ambulatory Visit (INDEPENDENT_AMBULATORY_CARE_PROVIDER_SITE_OTHER): Payer: No Typology Code available for payment source | Admitting: Certified Nurse Midwife

## 2020-01-03 ENCOUNTER — Other Ambulatory Visit: Payer: Self-pay

## 2020-01-03 VITALS — BP 101/67 | HR 86 | Wt 226.1 lb

## 2020-01-03 DIAGNOSIS — Z3A17 17 weeks gestation of pregnancy: Secondary | ICD-10-CM

## 2020-01-03 DIAGNOSIS — O99210 Obesity complicating pregnancy, unspecified trimester: Secondary | ICD-10-CM

## 2020-01-03 DIAGNOSIS — Z8759 Personal history of other complications of pregnancy, childbirth and the puerperium: Secondary | ICD-10-CM

## 2020-01-03 DIAGNOSIS — Z3689 Encounter for other specified antenatal screening: Secondary | ICD-10-CM | POA: Diagnosis not present

## 2020-01-03 DIAGNOSIS — Z3402 Encounter for supervision of normal first pregnancy, second trimester: Secondary | ICD-10-CM

## 2020-01-03 DIAGNOSIS — Z3A21 21 weeks gestation of pregnancy: Secondary | ICD-10-CM

## 2020-01-03 LAB — POCT URINALYSIS DIPSTICK OB
Bilirubin, UA: NEGATIVE
Blood, UA: NEGATIVE
Glucose, UA: NEGATIVE
Ketones, UA: NEGATIVE
Leukocytes, UA: NEGATIVE
Nitrite, UA: NEGATIVE
POC,PROTEIN,UA: NEGATIVE
Spec Grav, UA: 1.01 (ref 1.010–1.025)
Urobilinogen, UA: 0.2 E.U./dL
pH, UA: 5 (ref 5.0–8.0)

## 2020-01-03 NOTE — Progress Notes (Signed)
ROB doing well. Feels good movement. Anatomy u/s today (reviewed with pt) see below for results. Discussed anterior placenta in relationship to feeling fetal movement. She verbalizes understanding. Follow up 4 wks with Pattricia Boss .   [name], CNM   Patient Name: Natalie Ray DOB: Aug 22, 1980 MRN: [id number] ULTRASOUND REPORT  Location: [facility] Date of Service: 01/03/2020   Indications:Anatomy Ultrasound Findings:  Mason Jim intrauterine pregnancy is visualized with FHR at 145 BPM. Biometrics give an (U/S) Gestational age of [redacted]w[redacted]d and an (U/S) EDD of 05/08/2020; this correlates with the clinically established Estimated Date of Delivery: 05/15/20  Fetal presentation is Variable.  EFW: 470 g ( 1lb 1oz). Fetal Percentile  Placenta: anterior. Grade: 1 AFI: subjectively normal.  Anatomic survey is complete and normal; Gender - female.    Right Ovary is normal in appearance. Left Ovary is normal appearance. Survey of the adnexa demonstrates no adnexal masses. There is no free peritoneal fluid in the cul de sac.  Impression: 1. [redacted]w[redacted]d Viable Singleton Intrauterine pregnancy by U/S. 2. (U/S) EDD is consistent with Clinically established Estimated Date of Delivery: 05/15/20 . 3. Normal Anatomy Scan  Recommendations: 1.Clinical correlation with the patient's History and Physical Exam.   Jenine M. Marciano Sequin    RDMS

## 2020-01-03 NOTE — Patient Instructions (Signed)

## 2020-01-08 ENCOUNTER — Telehealth: Payer: Self-pay | Admitting: Certified Nurse Midwife
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
## 2020-01-08 NOTE — Telephone Encounter (Signed)
Spoke with patient to let her know she will need to go to L&D to be checked out. Patient stated that she is in [place] on business and would not be able to go. I Told her that she would need to go to one of the hospitals up there to be seen. Patient stated that she is not in that much pain. I explained that it is best to be checked just to make sure everything was ok with the baby. I spoke with Pattricia Boss about this and she agreed. I let the patient know that Pattricia Boss would like her to be checked out. Patient agreed to go be seen.

## 2020-01-30 ENCOUNTER — Ambulatory Visit (INDEPENDENT_AMBULATORY_CARE_PROVIDER_SITE_OTHER): Payer: No Typology Code available for payment source | Admitting: Certified Nurse Midwife

## 2020-01-30 ENCOUNTER — Other Ambulatory Visit: Payer: Self-pay

## 2020-01-30 ENCOUNTER — Encounter: Payer: Self-pay | Admitting: Certified Nurse Midwife

## 2020-01-30 VITALS — BP 101/65 | HR 83 | Wt 229.0 lb

## 2020-01-30 DIAGNOSIS — Z3A24 24 weeks gestation of pregnancy: Secondary | ICD-10-CM

## 2020-01-30 DIAGNOSIS — Z3482 Encounter for supervision of other normal pregnancy, second trimester: Secondary | ICD-10-CM

## 2020-01-30 LAB — POCT URINALYSIS DIPSTICK OB
Bilirubin, UA: NEGATIVE
Blood, UA: NEGATIVE
Glucose, UA: NEGATIVE
Ketones, UA: NEGATIVE
Leukocytes, UA: NEGATIVE
Nitrite, UA: NEGATIVE
POC,PROTEIN,UA: NEGATIVE
Spec Grav, UA: 1.025 (ref 1.010–1.025)
Urobilinogen, UA: 0.2 E.U./dL
pH, UA: 6 (ref 5.0–8.0)

## 2020-01-30 NOTE — Patient Instructions (Signed)
Postprandial Glucose Test Why am I having this test? You may have a postprandial glucose test (PPG) to:  Screen for diabetes (diabetes mellitus). More tests may be needed to confirm a diagnosis of diabetes.  Check how well your diabetes management plan is working, if you are already diagnosed with diabetes. If you have diabetes, you may need to have PPG tests frequently, especially if you: ? Take more than one medicine and are at risk for high or low blood sugar (glucose). ? Have a history of high blood glucose after meals. ? Are a woman who is pregnant and has diabetes or a temporary form of diabetes that develops during pregnancy (gestational diabetes mellitus). ? Are on a new or changing insulin regimen. What is being tested? Your blood glucose level is tested 2 hours after you eat a meal. This provides information about how well your body responds to glucose and starch (carbohydrates) after you eat a meal. What kind of sample is taken?  A blood sample is required for this test. It is usually collected by inserting a needle into a blood vessel. How do I prepare for this test? You will need to eat a meal that includes at least 75 grams of carbohydrate. The meal should include high-carbohydrate foods, such as bread, pasta, potatoes, and other starchy foods. Two hours after eating this meal, a blood sample is taken. Do not do any of the following until after your blood sample is taken:  Eat anything else. Eating anything during the testing period can affect test results. You may drink water during the testing period.  Drink alcohol.  Smoke.  Exercise.  Take any medicines that will affect your blood glucose. Tell a health care provider about:  Any allergies you have.  All medicines you are taking, including vitamins, herbs, eye drops, creams, and over-the-counter medicines.  Any blood disorders you have.  Any surgeries you have had.  Any medical conditions you have.  Whether  you are pregnant or may be pregnant.  Whether you are unable to finish your meal or drink, or you vomit. How are the results reported? Results will be reported as a value that shows how much glucose is in your blood. This may be reported as milligrams per deciliter (mg/dL) or millimoles per liter (mmol/L). Your health care provider will compare your results to normal ranges that were established after testing a large group of people (reference ranges). Reference ranges may vary among labs and hospitals. For this test, a common reference range is less than 140 mg/dL (7.8 mmol/L). If you have diabetes and you are being tested to see how well your diabetes management plan is working, your reference range is less than 180 mg/dL (23.5 mmol/L). What do the results mean? Results within the reference range may mean that you do not have diabetes. Results higher than your reference range may mean that you have:  Diabetes.  Gestational diabetes, if applicable.  Malnutrition.  Hyperthyroidism.  Acute stress response.  Cushing syndrome.  Tumors.  Kidney failure.  Acromegaly.  Liver disease. If you are already diagnosed with diabetes:  Results within your reference range mean that your diabetes management plan is working well and keeping your blood glucose in the normal range after you eat a meal.  Results higher than your reference range may mean that your diabetes management plan is not working well. You may need to work with your health care provider to adjust your plan. Talk with your health care provider about what  your results mean. Questions to ask your health care provider Ask your health care provider, or the department that is doing the test:  When will my results be ready?  How will I get my results?  What are my treatment options?  What other tests do I need?  What are my next steps? Summary  The postprandial glucose test (PPG) may be used to screen for diabetes  (diabetes mellitus).  If you are already diagnosed with diabetes, your health care provider may recommend that you have this test to check how well your diabetes management plan is working.  This test is done to see how well your body responds to glucose and starch (carbohydrates) after you eat a meal.  Talk with your health care provider about what your results mean. This information is not intended to replace advice given to you by your health care provider. Make sure you discuss any questions you have with your health care provider. Document Revised: 03/24/2018 Document Reviewed: 01/14/2017 Elsevier Patient Education  2020 [employer].

## 2020-01-30 NOTE — Progress Notes (Signed)
ROB doing well. Feels good movement. Discussed glucose screen next visit. She verbalizes understanding. Follow up 4 wk with Marcelino Duster.   [name], CNM

## 2020-02-26 ENCOUNTER — Other Ambulatory Visit: Payer: No Typology Code available for payment source

## 2020-02-26 ENCOUNTER — Other Ambulatory Visit: Payer: Self-pay

## 2020-02-26 ENCOUNTER — Ambulatory Visit (INDEPENDENT_AMBULATORY_CARE_PROVIDER_SITE_OTHER): Payer: No Typology Code available for payment source | Admitting: Certified Nurse Midwife

## 2020-02-26 ENCOUNTER — Encounter: Payer: Self-pay | Admitting: Certified Nurse Midwife

## 2020-02-26 VITALS — BP 106/77 | HR 87 | Wt 228.2 lb

## 2020-02-26 DIAGNOSIS — Z3483 Encounter for supervision of other normal pregnancy, third trimester: Secondary | ICD-10-CM

## 2020-02-26 DIAGNOSIS — Z3A28 28 weeks gestation of pregnancy: Secondary | ICD-10-CM

## 2020-02-26 DIAGNOSIS — Z8759 Personal history of other complications of pregnancy, childbirth and the puerperium: Secondary | ICD-10-CM

## 2020-02-26 DIAGNOSIS — Z23 Encounter for immunization: Secondary | ICD-10-CM

## 2020-02-26 LAB — POCT URINALYSIS DIPSTICK OB
Bilirubin, UA: NEGATIVE
Blood, UA: NEGATIVE
Glucose, UA: NEGATIVE
Ketones, UA: NEGATIVE
Leukocytes, UA: NEGATIVE
Nitrite, UA: NEGATIVE
POC,PROTEIN,UA: NEGATIVE
Spec Grav, UA: 1.025 (ref 1.010–1.025)
Urobilinogen, UA: 0.2 E.U./dL
pH, UA: 6.5 (ref 5.0–8.0)

## 2020-02-26 NOTE — Progress Notes (Signed)
ROB-Doing well. Questions regarding COVID vaccine, information sent via MyChart. TDaP given, see chart. 28 week and baseline PIH labs today, see orders. Wishes to exclusively pump; Baby Friendly education and blood transfusion consent completed. Third trimester handouts provided. Reviewed red flag symptoms and when to call. RTC x 2 week for ROB or sooner if needed.

## 2020-02-26 NOTE — Patient Instructions (Addendum)
Third Trimester of Pregnancy  The third trimester is from week 28 through week 40 (months 7 through 9). This trimester is when your unborn baby (fetus) is growing very fast. At the end of the ninth month, the unborn baby is about 20 inches in length. It weighs about 6-10 pounds. Follow these instructions at home: Medicines  Take over-the-counter and prescription medicines only as told by your doctor. Some medicines are safe and some medicines are not safe during pregnancy.  Take a prenatal vitamin that contains at least 600 micrograms (mcg) of folic acid.  If you have trouble pooping (constipation), take medicine that will make your stool soft (stool softener) if your doctor approves. Eating and drinking   Eat regular, healthy meals.  Avoid raw meat and uncooked cheese.  If you get low calcium from the food you eat, talk to your doctor about taking a daily calcium supplement.  Eat four or five small meals rather than three large meals a day.  Avoid foods that are high in fat and sugars, such as fried and sweet foods.  To prevent constipation: ? Eat foods that are high in fiber, like fresh fruits and vegetables, whole grains, and beans. ? Drink enough fluids to keep your pee (urine) clear or pale yellow. Activity  Exercise only as told by your doctor. Stop exercising if you start to have cramps.  Avoid heavy lifting, wear low heels, and sit up straight.  Do not exercise if it is too hot, too humid, or if you are in a place of great height (high altitude).  You may continue to have sex unless your doctor tells you not to. Relieving pain and discomfort  Wear a good support bra if your breasts are tender.  Take frequent breaks and rest with your legs raised if you have leg cramps or low back pain.  Take warm water baths (sitz baths) to soothe pain or discomfort caused by hemorrhoids. Use hemorrhoid cream if your doctor approves.  If you develop puffy, bulging veins (varicose  veins) in your legs: ? Wear support hose or compression stockings as told by your doctor. ? Raise (elevate) your feet for 15 minutes, 3-4 times a day. ? Limit salt in your food. Safety  Wear your seat belt when driving.  Make a list of emergency phone numbers, including numbers for family, friends, the hospital, and police and fire departments. Preparing for your baby's arrival To prepare for the arrival of your baby:  Take prenatal classes.  Practice driving to the hospital.  Visit the hospital and tour the maternity area.  Talk to your work about taking leave once the baby comes.  Pack your hospital bag.  Prepare the baby's room.  Go to your doctor visits.  Buy a rear-facing car seat. Learn how to install it in your car. General instructions  Do not use hot tubs, steam rooms, or saunas.  Do not use any products that contain nicotine or tobacco, such as cigarettes and e-cigarettes. If you need help quitting, ask your doctor.  Do not drink alcohol.  Do not douche or use tampons or scented sanitary pads.  Do not cross your legs for long periods of time.  Do not travel for long distances unless you must. Only do so if your doctor says it is okay.  Visit your dentist if you have not gone during your pregnancy. Use a soft toothbrush to brush your teeth. Be gentle when you floss.  Avoid cat litter boxes and soil   used by cats. These carry germs that can cause birth defects in the baby and can cause a loss of your baby (miscarriage) or stillbirth.  Keep all your prenatal visits as told by your doctor. This is important. Contact a doctor if:  You are not sure if you are in labor or if your water has broken.  You are dizzy.  You have mild cramps or pressure in your lower belly.  You have a nagging pain in your belly area.  You continue to feel sick to your stomach, you throw up, or you have watery poop.  You have bad smelling fluid coming from your vagina.  You have  pain when you pee. Get help right away if:  You have a fever.  You are leaking fluid from your vagina.  You are spotting or bleeding from your vagina.  You have severe belly cramps or pain.  You lose or gain weight quickly.  You have trouble catching your breath and have chest pain.  You notice sudden or extreme puffiness (swelling) of your face, hands, ankles, feet, or legs.  You have not felt the baby move in over an hour.  You have severe headaches that do not go away with medicine.  You have trouble seeing.  You are leaking, or you are having a gush of fluid, from your vagina before you are 37 weeks.  You have regular belly spasms (contractions) before you are 37 weeks. Summary  The third trimester is from week 28 through week 40 (months 7 through 9). This time is when your unborn baby is growing very fast.  Follow your doctor's advice about medicine, food, and activity.  Get ready for the arrival of your baby by taking prenatal classes, getting all the baby items ready, preparing the baby's room, and visiting your doctor to be checked.  Get help right away if you are bleeding from your vagina, or you have chest pain and trouble catching your breath, or if you have not felt your baby move in over an hour. This information is not intended to replace advice given to you by your health care provider. Make sure you discuss any questions you have with your health care provider. Document Revised: 09/22/2018 Document Reviewed: 07/07/2016 Elsevier Patient Education  2020 Else[employer] Inc[employer]   Fetal Movement Counts Patient Name: ________________________________________________ Patient Due Date: ____________________ What is a fetal movement count?  A fetal movement count is the number of times that you feel your baby move during a certain amount of time. This may also be called a fetal kick count. A fetal movement count is recommended for every pregnant woman. You may be asked to  start counting fetal movements as early as week 28 of your pregnancy. Pay attention to when your baby is most active. You may notice your baby's sleep and wake cycles. You may also notice things that make your baby move more. You should do a fetal movement count:  When your baby is normally most active.  At the same time each day. A good time to count movements is while you are resting, after having something to eat and drink. How do I count fetal movements? 1. Find a quiet, comfortable area. Sit, or lie down on your side. 2. Write down the date, the start time and stop time, and the number of movements that you felt between those two times. Take this information with you to your health care visits. 3. Write down your start time when you feel   the first movement. 4. Count kicks, flutters, swishes, rolls, and jabs. You should feel at least 10 movements. 5. You may stop counting after you have felt 10 movements, or if you have been counting for 2 hours. Write down the stop time. 6. If you do not feel 10 movements in 2 hours, contact your health care provider for further instructions. Your health care provider may want to do additional tests to assess your baby's well-being. Contact a health care provider if:  You feel fewer than 10 movements in 2 hours.  Your baby is not moving like he or she usually does. Date: ____________ Start time: ____________ Stop time: ____________ Movements: ____________ Date: ____________ Start time: ____________ Stop time: ____________ Movements: ____________ Date: ____________ Start time: ____________ Stop time: ____________ Movements: ____________ Date: ____________ Start time: ____________ Stop time: ____________ Movements: ____________ Date: ____________ Start time: ____________ Stop time: ____________ Movements: ____________ Date: ____________ Start time: ____________ Stop time: ____________ Movements: ____________ Date: ____________ Start time: ____________  Stop time: ____________ Movements: ____________ Date: ____________ Start time: ____________ Stop time: ____________ Movements: ____________ Date: ____________ Start time: ____________ Stop time: ____________ Movements: ____________ This information is not intended to replace advice given to you by your health care provider. Make sure you discuss any questions you have with your health care provider. Document Revised: 01/19/2019 Document Reviewed: 01/19/2019 Elsevier Patient Education  2020 Elsevier Inc.  

## 2020-02-27 ENCOUNTER — Telehealth: Payer: Self-pay | Admitting: Certified Nurse Midwife

## 2020-02-27 LAB — CBC
Hematocrit: 35.3 % (ref 34.0–46.6)
Hemoglobin: 12 g/dL (ref 11.1–15.9)
MCH: 31.5 pg (ref 26.6–33.0)
MCHC: 34 g/dL (ref 31.5–35.7)
MCV: 93 fL (ref 79–97)
Platelets: 220 10*3/uL (ref 150–450)
RBC: 3.81 x10E6/uL (ref 3.77–5.28)
RDW: 13.3 % (ref 11.7–15.4)
WBC: 13.4 10*3/uL — ABNORMAL HIGH (ref 3.4–10.8)

## 2020-02-27 LAB — COMPREHENSIVE METABOLIC PANEL
ALT: 9 IU/L (ref 0–32)
AST: 8 IU/L (ref 0–40)
Albumin/Globulin Ratio: 1.4 (ref 1.2–2.2)
Albumin: 3.6 g/dL — ABNORMAL LOW (ref 3.8–4.8)
Alkaline Phosphatase: 84 IU/L (ref 44–121)
BUN/Creatinine Ratio: 10 (ref 9–23)
BUN: 6 mg/dL (ref 6–20)
Bilirubin Total: <0.2 mg/dL (ref 0.0–1.2)
CO2: 22 mmol/L (ref 20–29)
Calcium: 8.9 mg/dL (ref 8.7–10.2)
Chloride: 101 mmol/L (ref 96–106)
Creatinine, Ser: 0.58 mg/dL (ref 0.57–1.00)
GFR calc Af Amer: 137 mL/min/{1.73_m2} (ref >59)
GFR calc non Af Amer: 119 mL/min/{1.73_m2} (ref >59)
Globulin, Total: 2.6 g/dL (ref 1.5–4.5)
Glucose: 142 mg/dL — ABNORMAL HIGH (ref 65–99)
Potassium: 3.9 mmol/L (ref 3.5–5.2)
Sodium: 136 mmol/L (ref 134–144)
Total Protein: 6.2 g/dL (ref 6.0–8.5)

## 2020-02-27 LAB — GLUCOSE, 1 HOUR GESTATIONAL: Gestational Diabetes Screen: 155 mg/dL — ABNORMAL HIGH (ref 65–139)

## 2020-02-27 LAB — RPR: RPR Ser Ql: NONREACTIVE

## 2020-02-27 LAB — URIC ACID: Uric Acid: 4.6 mg/dL (ref 2.6–6.2)

## 2020-02-27 LAB — PROTEIN / CREATININE RATIO, URINE
Creatinine, Urine: 168 mg/dL
Protein, Ur: 18.5 mg/dL
Protein/Creat Ratio: 110 mg/g creat (ref 0–200)

## 2020-02-27 NOTE — Telephone Encounter (Signed)
Pt call message received from [facility] [facility] front desk- Called and spoke to pt. Pt is concerned of lab results. Informed pt she sees results first however, Serafina Royals, CNM will review them and we will contact her. Pt states she is worried since she saw her glucose was elevated. Pt would like to know if she has gestational diabetes or what will happen next. I informed pt that Marcelino Duster is out of the office today and will return Thursday however, she will first review the lab results. Pt verbalized understanding.

## 2020-02-27 NOTE — Telephone Encounter (Signed)
Pt called in and stated that she got her test results back and that she is concerned about that results. I told the pt that she will get the results before the provider gets them. But I will send a message to the nurse. The pt verbally understood. Please advise

## 2020-02-29 ENCOUNTER — Other Ambulatory Visit: Payer: No Typology Code available for payment source

## 2020-02-29 ENCOUNTER — Other Ambulatory Visit: Payer: Self-pay

## 2020-02-29 DIAGNOSIS — Z3A29 29 weeks gestation of pregnancy: Secondary | ICD-10-CM

## 2020-03-01 ENCOUNTER — Telehealth: Payer: Self-pay

## 2020-03-01 ENCOUNTER — Other Ambulatory Visit: Payer: Self-pay | Admitting: Certified Nurse Midwife

## 2020-03-01 DIAGNOSIS — Z131 Encounter for screening for diabetes mellitus: Secondary | ICD-10-CM

## 2020-03-01 DIAGNOSIS — R7309 Other abnormal glucose: Secondary | ICD-10-CM

## 2020-03-01 LAB — GESTATIONAL GLUCOSE TOLERANCE
Glucose, Fasting: 80 mg/dL (ref 65–94)
Glucose, GTT - 1 Hour: 169 mg/dL (ref 65–179)
Glucose, GTT - 2 Hour: 120 mg/dL (ref 65–154)
Glucose, GTT - 3 Hour: 117 mg/dL (ref 65–139)

## 2020-03-01 NOTE — Progress Notes (Signed)
GTT ordered, see chart. One hour: 155.    Serafina Royals, CNM [facility] 03/01/20 4:56 PM

## 2020-03-01 NOTE — Telephone Encounter (Signed)
mychart message sent to patient

## 2020-03-05 ENCOUNTER — Telehealth: Payer: Self-pay

## 2020-03-05 NOTE — Telephone Encounter (Signed)
See previous documentation

## 2020-03-08 ENCOUNTER — Other Ambulatory Visit: Payer: Self-pay | Admitting: Certified Nurse Midwife

## 2020-03-12 ENCOUNTER — Ambulatory Visit (INDEPENDENT_AMBULATORY_CARE_PROVIDER_SITE_OTHER): Payer: No Typology Code available for payment source | Admitting: Certified Nurse Midwife

## 2020-03-12 ENCOUNTER — Encounter: Payer: Self-pay | Admitting: Certified Nurse Midwife

## 2020-03-12 ENCOUNTER — Other Ambulatory Visit: Payer: Self-pay

## 2020-03-12 VITALS — BP 94/62 | HR 98 | Wt 227.1 lb

## 2020-03-12 DIAGNOSIS — Z3A30 30 weeks gestation of pregnancy: Secondary | ICD-10-CM

## 2020-03-12 DIAGNOSIS — N6452 Nipple discharge: Secondary | ICD-10-CM

## 2020-03-12 MED ORDER — DICLOXACILLIN SODIUM 500 MG PO CAPS: 500.0000 mg | ORAL_CAPSULE | Freq: Four times a day (QID) | ORAL | 0 refills | Status: AC

## 2020-03-12 NOTE — Progress Notes (Signed)
Rob doing well. Pt has c/o nipple discharge with an abscess on her right breast. She noticed a marbile sized lump that occurred a few days ago. The mass has increased in size approximately 3 x 3 cm at 9 o clock 1 cm from nipple. Drainage is cream , light yellow in color. The mass is firm and tender to touch, no redness, no fever. Culture collected. Will start on antibiotics. Pt encouarged to use tylenol and warm compress to area. Pt instructed not to stimulate breast. Dicloxacillin ordered. Will follow up with culture results. Red flag symptoms reviewed with pt.   [name], CNM

## 2020-03-16 LAB — ANAEROBIC AND AEROBIC CULTURE

## 2020-03-25 ENCOUNTER — Telehealth: Payer: Self-pay

## 2020-03-25 NOTE — Telephone Encounter (Signed)
Called patient- changed appointment scheduled for 03/26/20 to a televisit due to possible COVID exposure per [name] CNM. Patient expressed understanding.

## 2020-03-26 ENCOUNTER — Ambulatory Visit (INDEPENDENT_AMBULATORY_CARE_PROVIDER_SITE_OTHER): Payer: No Typology Code available for payment source | Admitting: Certified Nurse Midwife

## 2020-03-26 ENCOUNTER — Other Ambulatory Visit: Payer: Self-pay

## 2020-03-26 VITALS — BP 113/78 | HR 102 | Wt 227.0 lb

## 2020-03-26 DIAGNOSIS — Z3493 Encounter for supervision of normal pregnancy, unspecified, third trimester: Secondary | ICD-10-CM

## 2020-03-26 NOTE — Progress Notes (Signed)
Virtual Visit via Telephone Note  I connected with Natalie Ray on 03/26/20 at  9:45 AM EDT by telephone and verified that I am speaking with the correct person using two identifiers.   I discussed the limitations, risks, security and privacy concerns of performing an evaluation and management service by telephone and the availability of in person appointments. I also discussed with the patient that there may be a patient responsible charge related to this service. The patient expressed understanding and agreed to proceed.  Present on phone Patient @ home Natalie Ray, [place] @ office Natalie Ray, nurse @ office  History of Present Illness: G2P1 32.[redacted] wks pregnant , exposed to covid on Saturday 10/9. She is in quarantine over then next 2 wks. She denies any symptoms at this time. She feels good fetal movement. Denies contractions.     Observations/Objective: Asymptomatic for Covid @ this time, feels good fetal movement.  Lump on breast has resoved after course of antibiotics completed.  Follow Up Instructions: Red flag symptoms reviewed. Pt to go to the hospital if symptoms occur or reach out for orders to test for COVID. Reviewed fetal movement. Follow up in 2 wks at the office if remains asymptomatic.     I discussed the assessment and treatment plan with the patient. The patient was provided an opportunity to ask questions and all were answered. The patient agreed with the plan and demonstrated an understanding of the instructions.   The patient was advised to call back or seek an in-person evaluation if the symptoms worsen or if the condition fails to improve as anticipated.  I provided 7 minutes of non-face-to-face time during this encounter.   Natalie Ray, CNM

## 2020-03-26 NOTE — Progress Notes (Signed)
Due to possible exposure to COVID 19- patients in office regularly scheduled visit was changed to a televisit. DOB as identifier. Patient states she feels well. No changes. Call transferred to [name] CNM for completion of televisit.

## 2020-04-08 ENCOUNTER — Encounter: Payer: Self-pay | Admitting: Certified Nurse Midwife

## 2020-04-08 ENCOUNTER — Encounter: Payer: No Typology Code available for payment source | Admitting: Certified Nurse Midwife

## 2020-04-08 ENCOUNTER — Ambulatory Visit (INDEPENDENT_AMBULATORY_CARE_PROVIDER_SITE_OTHER): Payer: No Typology Code available for payment source | Admitting: Certified Nurse Midwife

## 2020-04-08 ENCOUNTER — Other Ambulatory Visit: Payer: Self-pay

## 2020-04-08 VITALS — BP 102/78 | HR 87 | Wt 227.8 lb

## 2020-04-08 DIAGNOSIS — Z3A34 34 weeks gestation of pregnancy: Secondary | ICD-10-CM

## 2020-04-08 DIAGNOSIS — Z3483 Encounter for supervision of other normal pregnancy, third trimester: Secondary | ICD-10-CM

## 2020-04-08 LAB — POCT URINALYSIS DIPSTICK OB
Bilirubin, UA: NEGATIVE
Blood, UA: NEGATIVE
Glucose, UA: NEGATIVE
Ketones, UA: NEGATIVE
Leukocytes, UA: NEGATIVE
Nitrite, UA: NEGATIVE
Spec Grav, UA: 1.025 (ref 1.010–1.025)
Urobilinogen, UA: 0.2 E.U./dL
pH, UA: 6 (ref 5.0–8.0)

## 2020-04-08 NOTE — Progress Notes (Signed)
ROB-Doing well after COVID exposure. No questions or concerns. Anticipatory guidance regarding course of prenatal care. Reviewed red flag symptoms and when to call. RTC x 2 weeks for 36 week cultures and ROB or sooner if needed.

## 2020-04-08 NOTE — Patient Instructions (Signed)
Fetal Movement Counts Patient Name: ________________________________________________ Patient Due Date: ____________________ What is a fetal movement count?  A fetal movement count is the number of times that you feel your baby move during a certain amount of time. This may also be called a fetal kick count. A fetal movement count is recommended for every pregnant woman. You may be asked to start counting fetal movements as early as week 28 of your pregnancy. Pay attention to when your baby is most active. You may notice your baby's sleep and wake cycles. You may also notice things that make your baby move more. You should do a fetal movement count:  When your baby is normally most active.  At the same time each day. A good time to count movements is while you are resting, after having something to eat and drink. How do I count fetal movements? 1. Find a quiet, comfortable area. Sit, or lie down on your side. 2. Write down the date, the start time and stop time, and the number of movements that you felt between those two times. Take this information with you to your health care visits. 3. Write down your start time when you feel the first movement. 4. Count kicks, flutters, swishes, rolls, and jabs. You should feel at least 10 movements. 5. You may stop counting after you have felt 10 movements, or if you have been counting for 2 hours. Write down the stop time. 6. If you do not feel 10 movements in 2 hours, contact your health care provider for further instructions. Your health care provider may want to do additional tests to assess your baby's well-being. Contact a health care provider if:  You feel fewer than 10 movements in 2 hours.  Your baby is not moving like he or she usually does. Date: ____________ Start time: ____________ Stop time: ____________ Movements: ____________ Date: ____________ Start time: ____________ Stop time: ____________ Movements: ____________ Date: ____________  Start time: ____________ Stop time: ____________ Movements: ____________ Date: ____________ Start time: ____________ Stop time: ____________ Movements: ____________ Date: ____________ Start time: ____________ Stop time: ____________ Movements: ____________ Date: ____________ Start time: ____________ Stop time: ____________ Movements: ____________ Date: ____________ Start time: ____________ Stop time: ____________ Movements: ____________ Date: ____________ Start time: ____________ Stop time: ____________ Movements: ____________ Date: ____________ Start time: ____________ Stop time: ____________ Movements: ____________ This information is not intended to replace advice given to you by your health care provider. Make sure you discuss any questions you have with your health care provider. Document Revised: 01/19/2019 Document Reviewed: 01/19/2019 Elsevier Patient Education  2020 [employer] Inc.  

## 2020-04-24 ENCOUNTER — Encounter: Payer: Self-pay | Admitting: Certified Nurse Midwife

## 2020-04-24 ENCOUNTER — Ambulatory Visit (INDEPENDENT_AMBULATORY_CARE_PROVIDER_SITE_OTHER): Payer: No Typology Code available for payment source | Admitting: Certified Nurse Midwife

## 2020-04-24 ENCOUNTER — Other Ambulatory Visit: Payer: Self-pay

## 2020-04-24 VITALS — BP 110/83 | HR 90 | Wt 231.4 lb

## 2020-04-24 DIAGNOSIS — Z3A37 37 weeks gestation of pregnancy: Secondary | ICD-10-CM

## 2020-04-24 LAB — POCT URINALYSIS DIPSTICK OB
Bilirubin, UA: NEGATIVE
Blood, UA: NEGATIVE
Glucose, UA: NEGATIVE
Ketones, UA: NEGATIVE
Leukocytes, UA: NEGATIVE
Nitrite, UA: NEGATIVE
POC,PROTEIN,UA: NEGATIVE
Spec Grav, UA: 1.025 (ref 1.010–1.025)
Urobilinogen, UA: 0.2 E.U./dL
pH, UA: 5 (ref 5.0–8.0)

## 2020-04-24 NOTE — Progress Notes (Signed)
ROB doing well. GBS and cultures completed today. SVE per pt request. Ft/ thick/-3. Labor precautions reviewed. Herbal prep handout given. Follow up 1 wk.   [name], CNM

## 2020-04-24 NOTE — Patient Instructions (Signed)
Braxton Hicks Contractions °Contractions of the uterus can occur throughout pregnancy, but they are not always a sign that you are in labor. You may have practice contractions called Braxton Hicks contractions. These false labor contractions are sometimes confused with true labor. °What are Braxton Hicks contractions? °Braxton Hicks contractions are tightening movements that occur in the muscles of the uterus before labor. Unlike true labor contractions, these contractions do not result in opening (dilation) and thinning of the cervix. Toward the end of pregnancy (32-34 weeks), Braxton Hicks contractions can happen more often and may become stronger. These contractions are sometimes difficult to tell apart from true labor because they can be very uncomfortable. You should not feel embarrassed if you go to the hospital with false labor. °Sometimes, the only way to tell if you are in true labor is for your health care provider to look for changes in the cervix. The health care provider will do a physical exam and may monitor your contractions. If you are not in true labor, the exam should show that your cervix is not dilating and your water has not broken. °If there are no other health problems associated with your pregnancy, it is completely safe for you to be sent home with false labor. You may continue to have Braxton Hicks contractions until you go into true labor. °How to tell the difference between true labor and false labor °True labor °· Contractions last 30-70 seconds. °· Contractions become very regular. °· Discomfort is usually felt in the top of the uterus, and it spreads to the lower abdomen and low back. °· Contractions do not go away with walking. °· Contractions usually become more intense and increase in frequency. °· The cervix dilates and gets thinner. °False labor °· Contractions are usually shorter and not as strong as true labor contractions. °· Contractions are usually irregular. °· Contractions  are often felt in the front of the lower abdomen and in the groin. °· Contractions may go away when you walk around or change positions while lying down. °· Contractions get weaker and are shorter-lasting as time goes on. °· The cervix usually does not dilate or become thin. °Follow these instructions at home: ° °· Take over-the-counter and prescription medicines only as told by your health care provider. °· Keep up with your usual exercises and follow other instructions from your health care provider. °· Eat and drink lightly if you think you are going into labor. °· If Braxton Hicks contractions are making you uncomfortable: °? Change your position from lying down or resting to walking, or change from walking to resting. °? Sit and rest in a tub of warm water. °? Drink enough fluid to keep your urine pale yellow. Dehydration may cause these contractions. °? Do slow and deep breathing several times an hour. °· Keep all follow-up prenatal visits as told by your health care provider. This is important. °Contact a health care provider if: °· You have a fever. °· You have continuous pain in your abdomen. °Get help right away if: °· Your contractions become stronger, more regular, and closer together. °· You have fluid leaking or gushing from your vagina. °· You pass blood-tinged mucus (bloody show). °· You have bleeding from your vagina. °· You have low back pain that you never had before. °· You feel your baby’s head pushing down and causing pelvic pressure. °· Your baby is not moving inside you as much as it used to. °Summary °· Contractions that occur before labor are   called Braxton Hicks contractions, false labor, or practice contractions. °· Braxton Hicks contractions are usually shorter, weaker, farther apart, and less regular than true labor contractions. True labor contractions usually become progressively stronger and regular, and they become more frequent. °· Manage discomfort from Braxton Hicks contractions  by changing position, resting in a warm bath, drinking plenty of water, or practicing deep breathing. °This information is not intended to replace advice given to you by your health care provider. Make sure you discuss any questions you have with your health care provider. °Document Revised: 05/14/2017 Document Reviewed: 10/15/2016 °Elsevier Patient Education © 2020 [employer] ° °

## 2020-04-26 LAB — STREP GP B NAA: Strep Gp B NAA: NEGATIVE

## 2020-04-27 LAB — GC/CHLAMYDIA PROBE AMP
Chlamydia trachomatis, NAA: NEGATIVE
Neisseria Gonorrhoeae by PCR: NEGATIVE

## 2020-04-29 ENCOUNTER — Other Ambulatory Visit: Payer: Self-pay

## 2020-04-29 ENCOUNTER — Ambulatory Visit (INDEPENDENT_AMBULATORY_CARE_PROVIDER_SITE_OTHER): Payer: No Typology Code available for payment source | Admitting: Certified Nurse Midwife

## 2020-04-29 ENCOUNTER — Encounter: Payer: Self-pay | Admitting: Certified Nurse Midwife

## 2020-04-29 VITALS — BP 107/77 | HR 86 | Wt 234.4 lb

## 2020-04-29 DIAGNOSIS — Z3A37 37 weeks gestation of pregnancy: Secondary | ICD-10-CM

## 2020-04-29 LAB — POCT URINALYSIS DIPSTICK OB
Bilirubin, UA: NEGATIVE
Blood, UA: NEGATIVE
Glucose, UA: NEGATIVE
Ketones, UA: NEGATIVE
Leukocytes, UA: NEGATIVE
Nitrite, UA: NEGATIVE
POC,PROTEIN,UA: NEGATIVE
Spec Grav, UA: 1.015 (ref 1.010–1.025)
Urobilinogen, UA: 0.2 E.U./dL
pH, UA: 5 (ref 5.0–8.0)

## 2020-04-29 NOTE — Progress Notes (Signed)
ROB doing well. Feels good movement. Labor precautions reviewed. SVE per pt request 1/60/-2 station. Follow up 1 wk with me.   [name], CNM

## 2020-04-29 NOTE — Patient Instructions (Signed)
Braxton Hicks Contractions °Contractions of the uterus can occur throughout pregnancy, but they are not always a sign that you are in labor. You may have practice contractions called Braxton Hicks contractions. These false labor contractions are sometimes confused with true labor. °What are Braxton Hicks contractions? °Braxton Hicks contractions are tightening movements that occur in the muscles of the uterus before labor. Unlike true labor contractions, these contractions do not result in opening (dilation) and thinning of the cervix. Toward the end of pregnancy (32-34 weeks), Braxton Hicks contractions can happen more often and may become stronger. These contractions are sometimes difficult to tell apart from true labor because they can be very uncomfortable. You should not feel embarrassed if you go to the hospital with false labor. °Sometimes, the only way to tell if you are in true labor is for your health care provider to look for changes in the cervix. The health care provider will do a physical exam and may monitor your contractions. If you are not in true labor, the exam should show that your cervix is not dilating and your water has not broken. °If there are no other health problems associated with your pregnancy, it is completely safe for you to be sent home with false labor. You may continue to have Braxton Hicks contractions until you go into true labor. °How to tell the difference between true labor and false labor °True labor °· Contractions last 30-70 seconds. °· Contractions become very regular. °· Discomfort is usually felt in the top of the uterus, and it spreads to the lower abdomen and low back. °· Contractions do not go away with walking. °· Contractions usually become more intense and increase in frequency. °· The cervix dilates and gets thinner. °False labor °· Contractions are usually shorter and not as strong as true labor contractions. °· Contractions are usually irregular. °· Contractions  are often felt in the front of the lower abdomen and in the groin. °· Contractions may go away when you walk around or change positions while lying down. °· Contractions get weaker and are shorter-lasting as time goes on. °· The cervix usually does not dilate or become thin. °Follow these instructions at home: ° °· Take over-the-counter and prescription medicines only as told by your health care provider. °· Keep up with your usual exercises and follow other instructions from your health care provider. °· Eat and drink lightly if you think you are going into labor. °· If Braxton Hicks contractions are making you uncomfortable: °? Change your position from lying down or resting to walking, or change from walking to resting. °? Sit and rest in a tub of warm water. °? Drink enough fluid to keep your urine pale yellow. Dehydration may cause these contractions. °? Do slow and deep breathing several times an hour. °· Keep all follow-up prenatal visits as told by your health care provider. This is important. °Contact a health care provider if: °· You have a fever. °· You have continuous pain in your abdomen. °Get help right away if: °· Your contractions become stronger, more regular, and closer together. °· You have fluid leaking or gushing from your vagina. °· You pass blood-tinged mucus (bloody show). °· You have bleeding from your vagina. °· You have low back pain that you never had before. °· You feel your baby’s head pushing down and causing pelvic pressure. °· Your baby is not moving inside you as much as it used to. °Summary °· Contractions that occur before labor are   called Braxton Hicks contractions, false labor, or practice contractions. °· Braxton Hicks contractions are usually shorter, weaker, farther apart, and less regular than true labor contractions. True labor contractions usually become progressively stronger and regular, and they become more frequent. °· Manage discomfort from Braxton Hicks contractions  by changing position, resting in a warm bath, drinking plenty of water, or practicing deep breathing. °This information is not intended to replace advice given to you by your health care provider. Make sure you discuss any questions you have with your health care provider. °Document Revised: 05/14/2017 Document Reviewed: 10/15/2016 °Elsevier Patient Education © 2020 [employer] ° °

## 2020-05-02 ENCOUNTER — Other Ambulatory Visit: Payer: Self-pay

## 2020-05-02 MED ORDER — DICLOXACILLIN SODIUM 500 MG PO CAPS: 500.0000 mg | ORAL_CAPSULE | Freq: Four times a day (QID) | ORAL | 0 refills | Status: DC

## 2020-05-06 ENCOUNTER — Other Ambulatory Visit: Payer: Self-pay

## 2020-05-06 ENCOUNTER — Ambulatory Visit (INDEPENDENT_AMBULATORY_CARE_PROVIDER_SITE_OTHER): Payer: No Typology Code available for payment source | Admitting: Certified Nurse Midwife

## 2020-05-06 ENCOUNTER — Encounter: Payer: Self-pay | Admitting: Certified Nurse Midwife

## 2020-05-06 VITALS — BP 114/78 | HR 89 | Wt 234.1 lb

## 2020-05-06 DIAGNOSIS — Z3A38 38 weeks gestation of pregnancy: Secondary | ICD-10-CM

## 2020-05-06 LAB — POCT URINALYSIS DIPSTICK OB
Bilirubin, UA: NEGATIVE
Blood, UA: NEGATIVE
Glucose, UA: NEGATIVE
Ketones, UA: NEGATIVE
Leukocytes, UA: NEGATIVE
Nitrite, UA: NEGATIVE
Spec Grav, UA: 1.025 (ref 1.010–1.025)
Urobilinogen, UA: 0.2 E.U./dL
pH, UA: 5 (ref 5.0–8.0)

## 2020-05-06 NOTE — Progress Notes (Signed)
ROB doing well. Feels good fetal movement. Labor precautions reviewed. SVE per pt request 1/70/-2, Discussed u/s next appointment for growth and AFI. Discussed induction 40-41 wks . She verbalizes and agrees to plan of care. Follow up 1 wk with Lorn Junes, CNM

## 2020-05-06 NOTE — Patient Instructions (Signed)
Braxton Hicks Contractions °Contractions of the uterus can occur throughout pregnancy, but they are not always a sign that you are in labor. You may have practice contractions called Braxton Hicks contractions. These false labor contractions are sometimes confused with true labor. °What are Braxton Hicks contractions? °Braxton Hicks contractions are tightening movements that occur in the muscles of the uterus before labor. Unlike true labor contractions, these contractions do not result in opening (dilation) and thinning of the cervix. Toward the end of pregnancy (32-34 weeks), Braxton Hicks contractions can happen more often and may become stronger. These contractions are sometimes difficult to tell apart from true labor because they can be very uncomfortable. You should not feel embarrassed if you go to the hospital with false labor. °Sometimes, the only way to tell if you are in true labor is for your health care provider to look for changes in the cervix. The health care provider will do a physical exam and may monitor your contractions. If you are not in true labor, the exam should show that your cervix is not dilating and your water has not broken. °If there are no other health problems associated with your pregnancy, it is completely safe for you to be sent home with false labor. You may continue to have Braxton Hicks contractions until you go into true labor. °How to tell the difference between true labor and false labor °True labor °· Contractions last 30-70 seconds. °· Contractions become very regular. °· Discomfort is usually felt in the top of the uterus, and it spreads to the lower abdomen and low back. °· Contractions do not go away with walking. °· Contractions usually become more intense and increase in frequency. °· The cervix dilates and gets thinner. °False labor °· Contractions are usually shorter and not as strong as true labor contractions. °· Contractions are usually irregular. °· Contractions  are often felt in the front of the lower abdomen and in the groin. °· Contractions may go away when you walk around or change positions while lying down. °· Contractions get weaker and are shorter-lasting as time goes on. °· The cervix usually does not dilate or become thin. °Follow these instructions at home: ° °· Take over-the-counter and prescription medicines only as told by your health care provider. °· Keep up with your usual exercises and follow other instructions from your health care provider. °· Eat and drink lightly if you think you are going into labor. °· If Braxton Hicks contractions are making you uncomfortable: °? Change your position from lying down or resting to walking, or change from walking to resting. °? Sit and rest in a tub of warm water. °? Drink enough fluid to keep your urine pale yellow. Dehydration may cause these contractions. °? Do slow and deep breathing several times an hour. °· Keep all follow-up prenatal visits as told by your health care provider. This is important. °Contact a health care provider if: °· You have a fever. °· You have continuous pain in your abdomen. °Get help right away if: °· Your contractions become stronger, more regular, and closer together. °· You have fluid leaking or gushing from your vagina. °· You pass blood-tinged mucus (bloody show). °· You have bleeding from your vagina. °· You have low back pain that you never had before. °· You feel your baby’s head pushing down and causing pelvic pressure. °· Your baby is not moving inside you as much as it used to. °Summary °· Contractions that occur before labor are   called Braxton Hicks contractions, false labor, or practice contractions. °· Braxton Hicks contractions are usually shorter, weaker, farther apart, and less regular than true labor contractions. True labor contractions usually become progressively stronger and regular, and they become more frequent. °· Manage discomfort from Braxton Hicks contractions  by changing position, resting in a warm bath, drinking plenty of water, or practicing deep breathing. °This information is not intended to replace advice given to you by your health care provider. Make sure you discuss any questions you have with your health care provider. °Document Revised: 05/14/2017 Document Reviewed: 10/15/2016 °Elsevier Patient Education © 2020 [employer] ° °

## 2020-05-14 ENCOUNTER — Ambulatory Visit (INDEPENDENT_AMBULATORY_CARE_PROVIDER_SITE_OTHER): Payer: No Typology Code available for payment source | Admitting: Certified Nurse Midwife

## 2020-05-14 ENCOUNTER — Encounter: Payer: Self-pay | Admitting: Certified Nurse Midwife

## 2020-05-14 ENCOUNTER — Ambulatory Visit (INDEPENDENT_AMBULATORY_CARE_PROVIDER_SITE_OTHER): Payer: No Typology Code available for payment source

## 2020-05-14 ENCOUNTER — Other Ambulatory Visit: Payer: Self-pay

## 2020-05-14 VITALS — BP 123/83 | HR 80 | Wt 235.1 lb

## 2020-05-14 DIAGNOSIS — Z3A38 38 weeks gestation of pregnancy: Secondary | ICD-10-CM

## 2020-05-14 DIAGNOSIS — Z3A39 39 weeks gestation of pregnancy: Secondary | ICD-10-CM

## 2020-05-14 LAB — POCT URINALYSIS DIPSTICK OB
Bilirubin, UA: NEGATIVE
Blood, UA: NEGATIVE
Glucose, UA: NEGATIVE
Ketones, UA: NEGATIVE
Leukocytes, UA: NEGATIVE
Nitrite, UA: NEGATIVE
Spec Grav, UA: 1.02 (ref 1.010–1.025)
Urobilinogen, UA: 0.2 E.U./dL
pH, UA: 5 (ref 5.0–8.0)

## 2020-05-14 NOTE — Addendum Note (Signed)
Addended by: Mechele Claude on: 05/14/2020 07:23 PM   Modules accepted: Orders, SmartSet

## 2020-05-14 NOTE — Patient Instructions (Signed)
Braxton Hicks Contractions °Contractions of the uterus can occur throughout pregnancy, but they are not always a sign that you are in labor. You may have practice contractions called Braxton Hicks contractions. These false labor contractions are sometimes confused with true labor. °What are Braxton Hicks contractions? °Braxton Hicks contractions are tightening movements that occur in the muscles of the uterus before labor. Unlike true labor contractions, these contractions do not result in opening (dilation) and thinning of the cervix. Toward the end of pregnancy (32-34 weeks), Braxton Hicks contractions can happen more often and may become stronger. These contractions are sometimes difficult to tell apart from true labor because they can be very uncomfortable. You should not feel embarrassed if you go to the hospital with false labor. °Sometimes, the only way to tell if you are in true labor is for your health care provider to look for changes in the cervix. The health care provider will do a physical exam and may monitor your contractions. If you are not in true labor, the exam should show that your cervix is not dilating and your water has not broken. °If there are no other health problems associated with your pregnancy, it is completely safe for you to be sent home with false labor. You may continue to have Braxton Hicks contractions until you go into true labor. °How to tell the difference between true labor and false labor °True labor °· Contractions last 30-70 seconds. °· Contractions become very regular. °· Discomfort is usually felt in the top of the uterus, and it spreads to the lower abdomen and low back. °· Contractions do not go away with walking. °· Contractions usually become more intense and increase in frequency. °· The cervix dilates and gets thinner. °False labor °· Contractions are usually shorter and not as strong as true labor contractions. °· Contractions are usually irregular. °· Contractions  are often felt in the front of the lower abdomen and in the groin. °· Contractions may go away when you walk around or change positions while lying down. °· Contractions get weaker and are shorter-lasting as time goes on. °· The cervix usually does not dilate or become thin. °Follow these instructions at home: ° °· Take over-the-counter and prescription medicines only as told by your health care provider. °· Keep up with your usual exercises and follow other instructions from your health care provider. °· Eat and drink lightly if you think you are going into labor. °· If Braxton Hicks contractions are making you uncomfortable: °? Change your position from lying down or resting to walking, or change from walking to resting. °? Sit and rest in a tub of warm water. °? Drink enough fluid to keep your urine pale yellow. Dehydration may cause these contractions. °? Do slow and deep breathing several times an hour. °· Keep all follow-up prenatal visits as told by your health care provider. This is important. °Contact a health care provider if: °· You have a fever. °· You have continuous pain in your abdomen. °Get help right away if: °· Your contractions become stronger, more regular, and closer together. °· You have fluid leaking or gushing from your vagina. °· You pass blood-tinged mucus (bloody show). °· You have bleeding from your vagina. °· You have low back pain that you never had before. °· You feel your baby’s head pushing down and causing pelvic pressure. °· Your baby is not moving inside you as much as it used to. °Summary °· Contractions that occur before labor are   called Braxton Hicks contractions, false labor, or practice contractions. °· Braxton Hicks contractions are usually shorter, weaker, farther apart, and less regular than true labor contractions. True labor contractions usually become progressively stronger and regular, and they become more frequent. °· Manage discomfort from Braxton Hicks contractions  by changing position, resting in a warm bath, drinking plenty of water, or practicing deep breathing. °This information is not intended to replace advice given to you by your health care provider. Make sure you discuss any questions you have with your health care provider. °Document Revised: 05/14/2017 Document Reviewed: 10/15/2016 °Elsevier Patient Education © 2020 [employer] ° °

## 2020-05-14 NOTE — Progress Notes (Signed)
ROB doing well. Feels good movement U/s for growth/AFI. Results reviewed. Discussed induction , she is agreeable. SVE 3 /90/-2 station. Labor precautions reviewed. Follow up for induction as scheduled.    Patient Name: Natalie Ray DOB: Jan 21, 1981 MRN: [id number] ULTRASOUND REPORT  Location: [facility] Date of Service: 05/14/2020   Indications:growth/afi Findings:  Mason Jim intrauterine pregnancy is visualized with FHR at 135 BPM. .  Fetal presentation is Cephalic.  Placenta: posterior. Grade: 1 AFI: 11.3 cm  Growth percentile is 77. EFW: 3890 g ( 8 lbs 9 oz)   Impression: 1. [redacted]w[redacted]d Viable Singleton Intrauterine pregnancy previously established criteria. 2. Growth is 77 %ile.  AFI is 11.3 cm.   Recommendations: 1.Clinical correlation with the patient's History and Physical Exam.   Jenine  M. Marciano Sequin     RDMS

## 2020-05-16 ENCOUNTER — Inpatient Hospital Stay
Admission: EM | Admit: 2020-05-16 | Discharge: 2020-05-17 | DRG: 807 | Disposition: A | Discharge status: Home / Self Care | Payer: No Typology Code available for payment source | Attending: Certified Nurse Midwife | Admitting: Certified Nurse Midwife

## 2020-05-16 ENCOUNTER — Encounter: Payer: Self-pay | Admitting: Certified Nurse Midwife

## 2020-05-16 ENCOUNTER — Inpatient Hospital Stay: Payer: No Typology Code available for payment source | Admitting: Anesthesiology

## 2020-05-16 ENCOUNTER — Other Ambulatory Visit: Payer: Self-pay

## 2020-05-16 DIAGNOSIS — O480 Post-term pregnancy: Secondary | ICD-10-CM | POA: Diagnosis present

## 2020-05-16 DIAGNOSIS — O09523 Supervision of elderly multigravida, third trimester: Secondary | ICD-10-CM | POA: Diagnosis not present

## 2020-05-16 DIAGNOSIS — Z20822 Contact with and (suspected) exposure to covid-19: Secondary | ICD-10-CM | POA: Diagnosis present

## 2020-05-16 DIAGNOSIS — Z3A40 40 weeks gestation of pregnancy: Secondary | ICD-10-CM | POA: Diagnosis not present

## 2020-05-16 DIAGNOSIS — Z141 Cystic fibrosis carrier: Secondary | ICD-10-CM

## 2020-05-16 LAB — CBC
HCT: 33.1 % — ABNORMAL LOW (ref 36.0–46.0)
Hemoglobin: 11.1 g/dL — ABNORMAL LOW (ref 12.0–15.0)
MCH: 29.1 pg (ref 26.0–34.0)
MCHC: 33.5 g/dL (ref 30.0–36.0)
MCV: 86.9 fL (ref 80.0–100.0)
Platelets: 201 10*3/uL (ref 150–400)
RBC: 3.81 MIL/uL — ABNORMAL LOW (ref 3.87–5.11)
RDW: 14 % (ref 11.5–15.5)
WBC: 13.2 10*3/uL — ABNORMAL HIGH (ref 4.0–10.5)
nRBC: 0 % (ref 0.0–0.2)

## 2020-05-16 LAB — RESP PANEL BY RT-PCR (FLU A&B, COVID) ARPGX2
Influenza A by PCR: NEGATIVE
Influenza B by PCR: NEGATIVE
SARS Coronavirus 2 by RT PCR: NEGATIVE

## 2020-05-16 LAB — TYPE AND SCREEN
ABO/RH(D): A POS
Antibody Screen: NEGATIVE

## 2020-05-16 MED ORDER — WITCH HAZEL-GLYCERIN EX PADS: 1.0000 "application " | MEDICATED_PAD | CUTANEOUS | Status: DC | PRN

## 2020-05-16 MED ORDER — ONDANSETRON HCL 4 MG PO TABS: 4.0000 mg | ORAL_TABLET | ORAL | Status: DC | PRN

## 2020-05-16 MED ORDER — LIDOCAINE-EPINEPHRINE (PF) 1.5 %-1:200000 IJ SOLN
INTRAMUSCULAR | Status: DC | PRN
  Administered 2020-05-16: 3 mL via PERINEURAL

## 2020-05-16 MED ORDER — SENNOSIDES-DOCUSATE SODIUM 8.6-50 MG PO TABS
2.0000 | ORAL_TABLET | ORAL | Status: DC
  Administered 2020-05-17: 2 via ORAL
  Filled 2020-05-16: qty 2

## 2020-05-16 MED ORDER — BENZOCAINE-MENTHOL 20-0.5 % EX AERO: 1.0000 "application " | INHALATION_SPRAY | CUTANEOUS | Status: DC | PRN

## 2020-05-16 MED ORDER — ONDANSETRON HCL 4 MG/2ML IJ SOLN
INTRAMUSCULAR | Status: AC
  Filled 2020-05-16: qty 2

## 2020-05-16 MED ORDER — LIDOCAINE HCL (PF) 1 % IJ SOLN
INTRAMUSCULAR | Status: DC | PRN
  Administered 2020-05-16: 3 mL

## 2020-05-16 MED ORDER — FERROUS SULFATE 325 (65 FE) MG PO TABS
325.0000 mg | ORAL_TABLET | Freq: Every day | ORAL | Status: DC
  Administered 2020-05-17: 325 mg via ORAL
  Filled 2020-05-16: qty 1

## 2020-05-16 MED ORDER — FENTANYL 2.5 MCG/ML W/ROPIVACAINE 0.15% IN NS 100 ML EPIDURAL (ARMC): 12.0000 mL/h | EPIDURAL | Status: DC

## 2020-05-16 MED ORDER — EPHEDRINE 5 MG/ML INJ
10.0000 mg | INTRAVENOUS | Status: DC | PRN
  Filled 2020-05-16: qty 2

## 2020-05-16 MED ORDER — PHENYLEPHRINE 40 MCG/ML (10ML) SYRINGE FOR IV PUSH (FOR BLOOD PRESSURE SUPPORT)
80.0000 ug | PREFILLED_SYRINGE | INTRAVENOUS | Status: DC | PRN
  Filled 2020-05-16: qty 10

## 2020-05-16 MED ORDER — TERBUTALINE SULFATE 1 MG/ML IJ SOLN: 0.2500 mg | Freq: Once | INTRAMUSCULAR | Status: DC | PRN

## 2020-05-16 MED ORDER — IBUPROFEN 600 MG PO TABS
600.0000 mg | ORAL_TABLET | Freq: Four times a day (QID) | ORAL | Status: DC
  Administered 2020-05-17: 600 mg via ORAL
  Filled 2020-05-16: qty 1

## 2020-05-16 MED ORDER — BUTORPHANOL TARTRATE 1 MG/ML IJ SOLN: 1.0000 mg | INTRAMUSCULAR | Status: DC | PRN

## 2020-05-16 MED ORDER — ACETAMINOPHEN 325 MG PO TABS: 650.0000 mg | ORAL_TABLET | ORAL | Status: DC | PRN

## 2020-05-16 MED ORDER — AMMONIA AROMATIC IN INHA
RESPIRATORY_TRACT | Status: AC
  Filled 2020-05-16: qty 10

## 2020-05-16 MED ORDER — ACETAMINOPHEN 325 MG PO TABS
650.0000 mg | ORAL_TABLET | ORAL | Status: DC | PRN
  Administered 2020-05-17 (×3): 650 mg via ORAL
  Filled 2020-05-16 (×3): qty 2

## 2020-05-16 MED ORDER — MISOPROSTOL 50MCG HALF TABLET
50.0000 ug | ORAL_TABLET | ORAL | Status: DC
  Filled 2020-05-16: qty 1

## 2020-05-16 MED ORDER — LIDOCAINE HCL (PF) 1 % IJ SOLN
30.0000 mL | INTRAMUSCULAR | Status: DC | PRN
  Filled 2020-05-16: qty 30

## 2020-05-16 MED ORDER — LACTATED RINGERS IV SOLN: INTRAVENOUS | Status: DC

## 2020-05-16 MED ORDER — OXYCODONE-ACETAMINOPHEN 5-325 MG PO TABS: 1.0000 | ORAL_TABLET | ORAL | Status: DC | PRN

## 2020-05-16 MED ORDER — DIPHENHYDRAMINE HCL 50 MG/ML IJ SOLN: 12.5000 mg | INTRAMUSCULAR | Status: DC | PRN

## 2020-05-16 MED ORDER — BUPIVACAINE HCL (PF) 0.25 % IJ SOLN
INTRAMUSCULAR | Status: DC | PRN
  Administered 2020-05-16: 5 mL via EPIDURAL

## 2020-05-16 MED ORDER — FENTANYL 2.5 MCG/ML W/ROPIVACAINE 0.15% IN NS 100 ML EPIDURAL (ARMC)
EPIDURAL | Status: AC
  Filled 2020-05-16: qty 100

## 2020-05-16 MED ORDER — ONDANSETRON HCL 4 MG/2ML IJ SOLN: 4.0000 mg | Freq: Four times a day (QID) | INTRAMUSCULAR | Status: DC | PRN

## 2020-05-16 MED ORDER — OXYTOCIN BOLUS FROM INFUSION
333.0000 mL | Freq: Once | INTRAVENOUS | Status: AC
  Administered 2020-05-16: 333 mL via INTRAVENOUS

## 2020-05-16 MED ORDER — OXYCODONE-ACETAMINOPHEN 5-325 MG PO TABS: 2.0000 | ORAL_TABLET | ORAL | Status: DC | PRN

## 2020-05-16 MED ORDER — OXYTOCIN-SODIUM CHLORIDE 30-0.9 UT/500ML-% IV SOLN
1.0000 m[IU]/min | INTRAVENOUS | Status: DC
  Administered 2020-05-16: 4 m[IU]/min via INTRAVENOUS

## 2020-05-16 MED ORDER — COCONUT OIL OIL
1.0000 "application " | TOPICAL_OIL | Status: DC | PRN
  Administered 2020-05-17: 1 via TOPICAL
  Filled 2020-05-16: qty 120

## 2020-05-16 MED ORDER — PRENATAL MULTIVITAMIN CH
1.0000 | ORAL_TABLET | Freq: Every day | ORAL | Status: DC
  Filled 2020-05-16: qty 1

## 2020-05-16 MED ORDER — OXYTOCIN-SODIUM CHLORIDE 30-0.9 UT/500ML-% IV SOLN
2.5000 [IU]/h | INTRAVENOUS | Status: DC
  Administered 2020-05-16 (×2): 2.5 [IU]/h via INTRAVENOUS
  Filled 2020-05-16 (×3): qty 500

## 2020-05-16 MED ORDER — MISOPROSTOL 200 MCG PO TABS
ORAL_TABLET | ORAL | Status: AC
  Administered 2020-05-16: 50 ug via VAGINAL
  Filled 2020-05-16: qty 4

## 2020-05-16 MED ORDER — DOCUSATE SODIUM 100 MG PO CAPS
100.0000 mg | ORAL_CAPSULE | Freq: Two times a day (BID) | ORAL | Status: DC
  Administered 2020-05-17 (×2): 100 mg via ORAL
  Filled 2020-05-16 (×2): qty 1

## 2020-05-16 MED ORDER — METHYLERGONOVINE MALEATE 0.2 MG/ML IJ SOLN: 0.2000 mg | INTRAMUSCULAR | Status: DC | PRN

## 2020-05-16 MED ORDER — SOD CITRATE-CITRIC ACID 500-334 MG/5ML PO SOLN: 30.0000 mL | ORAL | Status: DC | PRN

## 2020-05-16 MED ORDER — LACTATED RINGERS IV SOLN: 500.0000 mL | INTRAVENOUS | Status: DC | PRN

## 2020-05-16 MED ORDER — METHYLERGONOVINE MALEATE 0.2 MG PO TABS: 0.2000 mg | ORAL_TABLET | ORAL | Status: DC | PRN

## 2020-05-16 MED ORDER — SIMETHICONE 80 MG PO CHEW: 80.0000 mg | CHEWABLE_TABLET | ORAL | Status: DC | PRN

## 2020-05-16 MED ORDER — DIBUCAINE (PERIANAL) 1 % EX OINT: 1.0000 "application " | TOPICAL_OINTMENT | CUTANEOUS | Status: DC | PRN

## 2020-05-16 MED ORDER — FENTANYL 2.5 MCG/ML W/ROPIVACAINE 0.15% IN NS 100 ML EPIDURAL (ARMC)
EPIDURAL | Status: DC | PRN
  Administered 2020-05-16: 12 mL/h via EPIDURAL

## 2020-05-16 MED ORDER — LACTATED RINGERS IV SOLN: 500.0000 mL | Freq: Once | INTRAVENOUS | Status: DC

## 2020-05-16 MED ORDER — ONDANSETRON HCL 4 MG/2ML IJ SOLN
4.0000 mg | INTRAMUSCULAR | Status: DC | PRN
  Administered 2020-05-16: 4 mg via INTRAVENOUS

## 2020-05-16 MED ORDER — OXYTOCIN 10 UNIT/ML IJ SOLN
INTRAMUSCULAR | Status: AC
  Filled 2020-05-16: qty 2

## 2020-05-16 NOTE — Progress Notes (Signed)
LABOR NOTE   Natalie Ray 38 y.o.@ at [redacted]w[redacted]d  SUBJECTIVE:  significant discomfort with contractions. Is requesting an epidural  Analgesia: planning, anethesia notified.   OBJECTIVE:  BP 132/75 (BP Location: Left Arm)   Pulse 91   Temp 98.7 F (37.1 C) (Oral)   Resp 18   Ht 5\' 8"  (1.727 m)   Wt 106.6 kg   LMP 08/08/2019   BMI 35.73 kg/m  Total I/O In: 149.9 [I.V.:149.9] Out: -   She has shown cervical change. CERVIX: 4:  90%:   -2:   posterior:   firm SVE:   Dilation: 3 Effacement (%): 90 Station: -2 Exam by:: [email or web address], CNM CONTRACTIONS: regular, every 1-2 minutes FHR: Fetal heart tracing reviewed. Baseline: 130 bpm, Variability: Good {> 6 bpm), Accelerations: Reactive and Decelerations: Absent Category I    Labs: Lab Results  Component Value Date   WBC 13.2 (H) 05/16/2020   HGB 11.1 (L) 05/16/2020   HCT 33.1 (L) 05/16/2020   MCV 86.9 05/16/2020   PLT 201 05/16/2020    ASSESSMENT: 1) Labor curve reviewed.       Progress: Early latent labor.     Membranes: ruptured, clear fluid         Active Problems:   Labor and delivery, indication for care   PLAN: continue present management, IV Pitocin induction and epidural placement PRN.    14/07/2019, CNM  05/16/2020 4:56 PM

## 2020-05-16 NOTE — Anesthesia Preprocedure Evaluation (Signed)
Anesthesia Evaluation  Patient identified by MRN, date of birth, ID band Patient awake    Reviewed: Allergy & Precautions, H&P , NPO status , Patient's Chart, lab work & pertinent test results  History of Anesthesia Complications Negative for: history of anesthetic complications  Airway Mallampati: II  TM Distance: >3 FB Neck ROM: full    Dental no notable dental hx.    Pulmonary neg pulmonary ROS, former smoker,    Pulmonary exam normal        Cardiovascular negative cardio ROS Normal cardiovascular exam     Neuro/Psych negative neurological ROS  negative psych ROS   GI/Hepatic negative GI ROS, Neg liver ROS,   Endo/Other  negative endocrine ROS  Renal/GU negative Renal ROS  negative genitourinary   Musculoskeletal   Abdominal   Peds  Hematology negative hematology ROS (+)   Anesthesia Other Findings   Reproductive/Obstetrics (+) Pregnancy                             Anesthesia Physical Anesthesia Plan  ASA: II  Anesthesia Plan:    Post-op Pain Management:    Induction:   PONV Risk Score and Plan:   Airway Management Planned:   Additional Equipment:   Intra-op Plan:   Post-operative Plan:   Informed Consent: I have reviewed the patients History and Physical, chart, labs and discussed the procedure including the risks, benefits and alternatives for the proposed anesthesia with the patient or authorized representative who has indicated his/her understanding and acceptance.       Plan Discussed with: Anesthesiologist and CRNA  Anesthesia Plan Comments:         Anesthesia Quick Evaluation
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
## 2020-05-16 NOTE — Progress Notes (Signed)
LABOR NOTE   Natalie Ray 38 y.o.@ at [redacted]w[redacted]d  SUBJECTIVE: mild discomfort with ctx.  Analgesia: Labor support without medications  OBJECTIVE:  BP 128/90   Pulse 91   Temp 98.4 F (36.9 C) (Oral)   Resp 18   Ht 5\' 8"  (1.727 m)   Wt 106.6 kg   LMP 08/08/2019   BMI 35.73 kg/m  No intake/output data recorded.  She has not shown cervical change. CERVIX: 3 cm:  90%:   -2:   posterior:   firm SVE:   Dilation: 3 Effacement (%): 90 Station: -2 Exam by:: [email or web address], CNM CONTRACTIONS: irregular FHR: Fetal heart tracing reviewed. Baseline: 130 bpm, Variability: Good {> 6 bpm), Accelerations: Reactive and Decelerations: Absent Category I    Labs: Lab Results  Component Value Date   WBC 13.2 (H) 05/16/2020   HGB 11.1 (L) 05/16/2020   HCT 33.1 (L) 05/16/2020   MCV 86.9 05/16/2020   PLT 201 05/16/2020    ASSESSMENT: 1) Labor curve reviewed.       Progress: SROM     Membranes: ruptured, clear fluid          Active Problems:   Labor and delivery, indication for care   PLAN: Start pitocin, IV pain medication or epidural prn.    14/07/2019, CNM 05/16/2020 1:50 PM

## 2020-05-17 LAB — CBC
HCT: 29.5 % — ABNORMAL LOW (ref 36.0–46.0)
Hemoglobin: 9.8 g/dL — ABNORMAL LOW (ref 12.0–15.0)
MCH: 29.7 pg (ref 26.0–34.0)
MCHC: 33.2 g/dL (ref 30.0–36.0)
MCV: 89.4 fL (ref 80.0–100.0)
Platelets: 178 10*3/uL (ref 150–400)
RBC: 3.3 MIL/uL — ABNORMAL LOW (ref 3.87–5.11)
RDW: 14.3 % (ref 11.5–15.5)
WBC: 16 10*3/uL — ABNORMAL HIGH (ref 4.0–10.5)
nRBC: 0 % (ref 0.0–0.2)

## 2020-05-17 LAB — RPR: RPR Ser Ql: NONREACTIVE

## 2020-05-17 MED ORDER — ACETAMINOPHEN 325 MG PO TABS
650.0000 mg | ORAL_TABLET | ORAL | 0 refills | Status: DC | PRN
Start: 2020-05-17 — End: 2020-07-09

## 2020-05-17 MED ORDER — IBUPROFEN 600 MG PO TABS
600.0000 mg | ORAL_TABLET | Freq: Four times a day (QID) | ORAL | 0 refills | Status: DC
Start: 2020-05-17 — End: 2020-07-09

## 2020-05-17 MED ORDER — IBUPROFEN 600 MG PO TABS
600.0000 mg | ORAL_TABLET | Freq: Four times a day (QID) | ORAL | Status: DC
  Administered 2020-05-17 (×3): 600 mg via ORAL
  Filled 2020-05-17 (×3): qty 1

## 2020-05-17 MED ORDER — DOCUSATE SODIUM 100 MG PO CAPS
100.0000 mg | ORAL_CAPSULE | Freq: Two times a day (BID) | ORAL | 0 refills | Status: DC
Start: 2020-05-17 — End: 2020-07-09

## 2020-05-17 MED ORDER — FERROUS SULFATE 325 (65 FE) MG PO TABS
325.0000 mg | ORAL_TABLET | Freq: Every day | ORAL | 3 refills | Status: DC
Start: 2020-05-18 — End: 2023-09-22

## 2020-05-17 NOTE — Progress Notes (Addendum)
All discharge instructions reviewed with patient; she verbalized understanding of same; copy given.Patient declined the rubella vaccine; patient states she "will get it at the doctor's office later".

## 2020-05-17 NOTE — Discharge Instructions (Signed)

## 2020-05-17 NOTE — Discharge Summary (Signed)
Patient Name: Natalie Ray DOB: [birth year] MRN: [id number]                            Discharge Summary  Date of Admission: 05/16/2020 Date of Discharge: 05/17/2020 Delivering Provider: [name]   Admitting Diagnosis: Labor and delivery, indication for care [O75.9] at [redacted]w[redacted]d Secondary diagnosis:  Active Problems:   Labor and delivery, indication for care   Mode of Delivery: normal spontaneous vaginal delivery              Discharge diagnosis: Term Pregnancy Delivered      Intrapartum Procedures: laceration 2nd   Post partum procedures: none  Complications: 2nd degree perineal laceration                     Discharge Day SOAP Note:  Progress Note - Vaginal Delivery  Natalie Ray is a 39 y.o. G2P2002 now PP day 1 s/p Vaginal, Spontaneous . Delivery was uncomplicated  Subjective  The patient has the following complaints: has no unusual complaints  Pain is controlled with current medications.   Patient is urinating without difficulty.  She is ambulating well.     Objective  Vital signs: BP 121/85 (BP Location: Right Arm)   Pulse 85   Temp 98.6 F (37 C) (Oral)   Resp 18   Ht 5\' 8"  (1.727 m)   Wt 106.6 kg   LMP 08/08/2019   SpO2 97%   Breastfeeding Unknown   BMI 35.73 kg/m   Physical Exam: Gen: NAD Fundus Fundal Tone: Firm  Lochia Amount: Small        Data Review Labs: Lab Results  Component Value Date   WBC 16.0 (H) 05/17/2020   HGB 9.8 (L) 05/17/2020   HCT 29.5 (L) 05/17/2020   MCV 89.4 05/17/2020   PLT 178 05/17/2020   CBC Latest Ref Rng & Units 05/17/2020 05/16/2020 02/26/2020  WBC 4.0 - 10.5 K/uL 16.0(H) 13.2(H) 13.4(H)  Hemoglobin 12.0 - 15.0 g/dL 02/28/2020) 11.1(L) 12.0  Hematocrit 36 - 46 % 29.5(L) 33.1(L) 35.3  Platelets 150 - 400 K/uL 178 201 220   A POS  Edinburgh Score: Edinburgh Postnatal Depression Scale Screening Tool 05/12/2018  I have been able to laugh and see the funny side of things. 0  I have looked forward with  enjoyment to things. 0  I have blamed myself unnecessarily when things went wrong. 1  I have been anxious or worried for no good reason. 3  I have felt scared or panicky for no good reason. 2  Things have been getting on top of me. 0  I have been so unhappy that I have had difficulty sleeping. 0  I have felt sad or miserable. 1  I have been so unhappy that I have been crying. 0  The thought of harming myself has occurred to me. 0  Edinburgh Postnatal Depression Scale Total 7    Assessment/Plan  Active Problems:   Labor and delivery, indication for care    Plan for discharge today.  Discharge Instructions: Per After Visit Summary. Activity: Advance as tolerated. Pelvic rest for 6 weeks.  Also refer to After Visit Summary Diet: Regular Medications: Allergies as of 05/17/2020   No Known Allergies     Medication List    STOP taking these medications   aspirin EC 81 MG tablet     TAKE these medications   acetaminophen 325 MG tablet Commonly  known as: Tylenol Take 2 tablets (650 mg total) by mouth every 4 (four) hours as needed (for pain scale < 4).   CitraNatal Bloom 90-1 MG Tabs Take 1 tablet by mouth daily.   docusate sodium 100 MG capsule Commonly known as: COLACE Take 1 capsule (100 mg total) by mouth 2 (two) times daily.   ferrous sulfate 325 (65 FE) MG tablet Take 1 tablet (325 mg total) by mouth daily with breakfast. Start taking on: May 18, 2020   ibuprofen 600 MG tablet Commonly known as: ADVIL Take 1 tablet (600 mg total) by mouth every 6 (six) hours.   pantoprazole 20 MG tablet Commonly known as: PROTONIX TAKE 1 TABLET BY MOUTH EVERY DAY      Outpatient follow up: 2 weeks tele visit, 6 week ppv with [name], CNM  Postpartum contraception: Considering IUD or nexplanon  Discharged Condition: good  Discharged to: home  Newborn Data: Disposition:home with mother  Apgars: APGAR (1 MIN): 8   APGAR (5 MINS): 9   APGAR (10 MINS):     Baby Feeding: Breast    [name], CNM  05/17/2020 3:31 PM

## 2020-05-17 NOTE — Progress Notes (Signed)
Patient discharged to home. Patient left 3rd floor via wheelchair with infant secured in car seat in her arms accompanied by Graciela Husbands RN and patient's husband.

## 2020-05-17 NOTE — Final Progress Note (Signed)
Discharge Day SOAP Note:  Progress Note - Vaginal Delivery  Natalie Ray is a 39 y.o. G2P2002 now PP day 1 s/p Vaginal, Spontaneous . Delivery was uncomplicated  Subjective  The patient has the following complaints: has no unusual complaints  Pain is controlled with current medications.   Patient is urinating without difficulty.  She is ambulating well.     Objective  Vital signs: BP 121/85 (BP Location: Right Arm)   Pulse 85   Temp 98.6 F (37 C) (Oral)   Resp 18   Ht 5\' 8"  (1.727 m)   Wt 106.6 kg   LMP 08/08/2019   SpO2 97%   Breastfeeding Unknown   BMI 35.73 kg/m   Physical Exam: Gen: NAD Fundus Fundal Tone: Firm  Lochia Amount: Small        Data Review Labs: Lab Results  Component Value Date   WBC 16.0 (H) 05/17/2020   HGB 9.8 (L) 05/17/2020   HCT 29.5 (L) 05/17/2020   MCV 89.4 05/17/2020   PLT 178 05/17/2020   CBC Latest Ref Rng & Units 05/17/2020 05/16/2020 02/26/2020  WBC 4.0 - 10.5 K/uL 16.0(H) 13.2(H) 13.4(H)  Hemoglobin 12.0 - 15.0 g/dL 02/28/2020) 11.1(L) 12.0  Hematocrit 36 - 46 % 29.5(L) 33.1(L) 35.3  Platelets 150 - 400 K/uL 178 201 220   A POS  Edinburgh Score: Edinburgh Postnatal Depression Scale Screening Tool 05/12/2018  I have been able to laugh and see the funny side of things. 0  I have looked forward with enjoyment to things. 0  I have blamed myself unnecessarily when things went wrong. 1  I have been anxious or worried for no good reason. 3  I have felt scared or panicky for no good reason. 2  Things have been getting on top of me. 0  I have been so unhappy that I have had difficulty sleeping. 0  I have felt sad or miserable. 1  I have been so unhappy that I have been crying. 0  The thought of harming myself has occurred to me. 0  Edinburgh Postnatal Depression Scale Total 7    Assessment/Plan  Active Problems:   Labor and delivery, indication for care    Plan for discharge today.  Discharge Instructions: Per After  Visit Summary. Activity: Advance as tolerated. Pelvic rest for 6 weeks.  Also refer to After Visit Summary Diet: Regular Medications: Allergies as of 05/17/2020   No Known Allergies     Medication List    STOP taking these medications   aspirin EC 81 MG tablet     TAKE these medications   acetaminophen 325 MG tablet Commonly known as: Tylenol Take 2 tablets (650 mg total) by mouth every 4 (four) hours as needed (for pain scale < 4).   CitraNatal Bloom 90-1 MG Tabs Take 1 tablet by mouth daily.   docusate sodium 100 MG capsule Commonly known as: COLACE Take 1 capsule (100 mg total) by mouth 2 (two) times daily.   ferrous sulfate 325 (65 FE) MG tablet Take 1 tablet (325 mg total) by mouth daily with breakfast. Start taking on: May 18, 2020   ibuprofen 600 MG tablet Commonly known as: ADVIL Take 1 tablet (600 mg total) by mouth every 6 (six) hours.   pantoprazole 20 MG tablet Commonly known as: PROTONIX TAKE 1 TABLET BY MOUTH EVERY DAY      Outpatient follow up: 2 weeks tele visit, 6 week ppv with May 20, 2020, CNM  Postpartum contraception: Considering IUD or  nexplanon  Discharged Condition: good  Discharged to: home  Newborn Data: Disposition:home with mother  Apgars: APGAR (1 MIN): 8   APGAR (5 MINS): 9   APGAR (10 MINS):    Baby Feeding: Breast    [name], CNM  05/17/2020 3:31 PM

## 2020-05-21 NOTE — Anesthesia Postprocedure Evaluation (Signed)
Anesthesia Post Note  Patient: Natalie Ray  Procedure(s) Performed: AN AD HOC LABOR EPIDURAL  Anesthetic complications: no   No complications documented.  Pt was discharged prior to being seen for post procedure visit.  No known complications related to epidural per chart review.        Karleen Hampshire

## 2020-05-21 NOTE — Anesthesia Procedure Notes (Addendum)
Epidural Patient location during procedure: OB Start time: 05/16/2020 5:08 PM End time: 05/16/2020 5:15 PM  Staffing Anesthesiologist: Karleen Hampshire, MD Resident/CRNA: Junious Silk, CRNA Performed: resident/CRNA   Preanesthetic Checklist Completed: patient identified, IV checked, site marked, risks and benefits discussed, surgical consent, monitors and equipment checked, pre-op evaluation and timeout performed  Epidural Patient position: sitting Prep: Betadine Patient monitoring: heart rate, continuous pulse ox and blood pressure Approach: midline Location: L4-L5 Injection technique: LOR saline  Needle:  Needle type: Tuohy  Needle gauge: 18 G Needle length: 9 cm and 9 Catheter type: closed end flexible Catheter size: 20 Guage Test dose: negative and 1.5% lidocaine with Epi 1:200 K  Assessment Events: blood not aspirated, injection not painful, no injection resistance, no paresthesia and negative IV test  Additional Notes Patient tolerated the insertion well by Junious Silk without complications.Reason for block:procedure for pain

## 2020-05-27 ENCOUNTER — Ambulatory Visit (INDEPENDENT_AMBULATORY_CARE_PROVIDER_SITE_OTHER): Payer: No Typology Code available for payment source | Admitting: Certified Nurse Midwife

## 2020-05-27 ENCOUNTER — Encounter: Payer: Self-pay | Admitting: Certified Nurse Midwife

## 2020-05-27 ENCOUNTER — Other Ambulatory Visit: Payer: Self-pay

## 2020-05-27 VITALS — BP 125/75 | Wt 211.0 lb

## 2020-05-27 DIAGNOSIS — Z1331 Encounter for screening for depression: Secondary | ICD-10-CM

## 2020-05-27 NOTE — Progress Notes (Signed)
Virtual Visit via Telephone Note  I connected with Natalie Ray on 05/27/20 at 11:15 AM EST by telephone and verified that I am speaking with the correct person using two identifiers.  Location: Patient: at home Provider: in office   I discussed the limitations, risks, security and privacy concerns of performing an evaluation and management service by telephone and the availability of in person appointments. I also discussed with the patient that there may be a patient responsible charge related to this service. The patient expressed understanding and agreed to proceed.   History of Present Illness:   G2 P2 SVD 12/2, 2 week postpartum mood check   Observations/Objective:  Doing well. Nursing and pumping. States she has some UTI like symptoms but she started taking the left over dicloxacillin and symptoms have resolved. Bleeding has declined , has some increase with activity.  States she sometimes feels a little sad because she misses being pregnant. Reassurance given.   Assessment and Plan:  Flowsheet Row Office Visit from 05/27/2020 in [facility]  PHQ-9 Total Score 0      Follow Up Instructions: Red flag symptoms reviewed for UTI, discussed urine drop of PRN. Follow up in office 4 wks for ppv.    I discussed the assessment and treatment plan with the patient. The patient was provided an opportunity to ask questions and all were answered. The patient agreed with the plan and demonstrated an understanding of the instructions.   The patient was advised to call back or seek an in-person evaluation if the symptoms worsen or if the condition fails to improve as anticipated.  I provided 7 minutes of non-face-to-face time during this encounter.   [name], CNM

## 2020-05-27 NOTE — Patient Instructions (Signed)

## 2020-05-27 NOTE — Progress Notes (Signed)
2 week televisit- DOB as identifier. Is breast feeding. Very light vaginal flow and usually only when she is pumping or walking. PhQ9=0. Call transferred to [name] CNM for completion of televisit.

## 2020-05-28 ENCOUNTER — Ambulatory Visit (INDEPENDENT_AMBULATORY_CARE_PROVIDER_SITE_OTHER): Payer: No Typology Code available for payment source | Admitting: Certified Nurse Midwife

## 2020-05-28 ENCOUNTER — Other Ambulatory Visit: Payer: Self-pay

## 2020-05-28 DIAGNOSIS — R300 Dysuria: Secondary | ICD-10-CM

## 2020-05-28 DIAGNOSIS — N390 Urinary tract infection, site not specified: Secondary | ICD-10-CM

## 2020-05-28 LAB — POCT URINALYSIS DIPSTICK
Bilirubin, UA: NEGATIVE
Glucose, UA: NEGATIVE
Ketones, UA: NEGATIVE
Nitrite, UA: NEGATIVE
Protein, UA: POSITIVE — AB
Spec Grav, UA: 1.02 (ref 1.010–1.025)
Urobilinogen, UA: 0.2 E.U./dL
pH, UA: 6 (ref 5.0–8.0)

## 2020-05-28 MED ORDER — NITROFURANTOIN MONOHYD MACRO 100 MG PO CAPS: 100.0000 mg | ORAL_CAPSULE | Freq: Two times a day (BID) | ORAL | 0 refills | Status: DC

## 2020-05-28 MED ORDER — FLUCONAZOLE 150 MG PO TABS: 150.0000 mg | ORAL_TABLET | Freq: Once | ORAL | 1 refills | Status: AC

## 2020-05-28 NOTE — Progress Notes (Signed)
Pt present for urine drop off. C/o burning, itching. Urine culture sent. Pt made aware 2-3 days for results. Pt verbalized understanding.

## 2020-05-28 NOTE — Addendum Note (Signed)
Addended by: Brooke Dare on: 05/28/2020 11:49 AM   Modules accepted: Orders

## 2020-06-03 LAB — URINE CULTURE

## 2020-06-04 ENCOUNTER — Other Ambulatory Visit: Payer: Self-pay | Admitting: Certified Nurse Midwife

## 2020-06-10 ENCOUNTER — Other Ambulatory Visit: Payer: Self-pay | Admitting: Certified Nurse Midwife

## 2020-06-10 MED ORDER — NITROFURANTOIN MONOHYD MACRO 100 MG PO CAPS: 100.0000 mg | ORAL_CAPSULE | Freq: Two times a day (BID) | ORAL | 0 refills | Status: AC

## 2020-06-19 ENCOUNTER — Other Ambulatory Visit: Payer: Self-pay

## 2020-06-19 ENCOUNTER — Ambulatory Visit: Payer: No Typology Code available for payment source

## 2020-06-19 DIAGNOSIS — R300 Dysuria: Secondary | ICD-10-CM

## 2020-06-19 LAB — POCT URINALYSIS DIPSTICK
Bilirubin, UA: NEGATIVE
Blood, UA: NEGATIVE
Glucose, UA: NEGATIVE
Ketones, UA: NEGATIVE
Leukocytes, UA: NEGATIVE
Nitrite, UA: NEGATIVE
Protein, UA: NEGATIVE
Spec Grav, UA: 1.02 (ref 1.010–1.025)
Urobilinogen, UA: 0.2 E.U./dL
pH, UA: 5 (ref 5.0–8.0)

## 2020-06-19 NOTE — Progress Notes (Unsigned)
Patient came to the office to leave a urine specimen per Ailene Ravel orders. This Clinical research associate was unaware she was coming in. Patient left urine specimen but was NOT seen by the nurse. Urine sample dipped and sent to lab for culture.

## 2020-06-21 ENCOUNTER — Other Ambulatory Visit: Payer: Self-pay

## 2020-06-21 ENCOUNTER — Telehealth: Payer: Self-pay

## 2020-06-21 LAB — URINE CULTURE

## 2020-06-21 MED ORDER — NITROFURANTOIN MONOHYD MACRO 100 MG PO CAPS: 100.0000 mg | ORAL_CAPSULE | Freq: Two times a day (BID) | ORAL | 0 refills | Status: DC

## 2020-06-21 MED ORDER — PHENAZOPYRIDINE HCL 200 MG PO TABS: 200.0000 mg | ORAL_TABLET | Freq: Three times a day (TID) | ORAL | 1 refills | Status: DC | PRN

## 2020-06-21 NOTE — Telephone Encounter (Signed)
-----   Message from Gunnar Bulla, CNM sent at 06/21/2020  3:19 PM EST ----- Small amount of bacteria present. Patient my have Pyridium or Urogesic Blue if discomfort continues. Rx Macrobid 100 mg PO BID for three (3) additional days. Thanks, JML

## 2020-06-21 NOTE — Telephone Encounter (Signed)
mychart message sent to patient

## 2020-06-21 NOTE — Progress Notes (Signed)
Small amount of bacteria present. Patient my have Pyridium or Urogesic Blue if discomfort continues. Rx Macrobid 100 mg PO BID for three (3) additional days. Thanks, JML

## 2020-07-01 ENCOUNTER — Encounter: Payer: No Typology Code available for payment source | Admitting: Certified Nurse Midwife

## 2020-07-09 ENCOUNTER — Ambulatory Visit (INDEPENDENT_AMBULATORY_CARE_PROVIDER_SITE_OTHER): Payer: No Typology Code available for payment source | Admitting: Certified Nurse Midwife

## 2020-07-09 ENCOUNTER — Other Ambulatory Visit: Payer: Self-pay

## 2020-07-09 ENCOUNTER — Encounter: Payer: Self-pay | Admitting: Certified Nurse Midwife

## 2020-07-09 MED ORDER — NORETHINDRONE 0.35 MG PO TABS: 1.0000 | ORAL_TABLET | Freq: Every day | ORAL | 11 refills | Status: DC

## 2020-07-09 NOTE — Progress Notes (Signed)
Subjective:    Natalie Ray is a 40 y.o. G25P2002 Caucasian female who presents for a postpartum visit. She is 6 weeks postpartum following a spontaneous vaginal delivery at 40.2 gestational weeks. Anesthesia: epidural. I have fully reviewed the prenatal and intrapartum course. Postpartum course has been wnl. Baby's course has been wnl Baby is feeding by breast. Bleeding no bleeding. Bowel function is normal. Bladder function is normal. Patient is not sexually active. . Contraception method is oral progesterone-only contraceptive. Postpartum depression screening: negative. Score 3.  Last pap 12/17/2016 and was negative , HPV negative.  The following portions of the patient's history were reviewed and updated as appropriate: allergies, current medications, past medical history, past surgical history and problem list.  Review of Systems Pertinent items are noted in HPI.   Vitals:   07/09/20 1418  BP: 104/68  Pulse: 79  Weight: 204 lb 6 oz (92.7 kg)  Height: 5\' 8"  (1.727 m)   No LMP recorded.  Objective:   General:  alert, cooperative and no distress   Breasts:  deferred, no complaints  Lungs: clear to auscultation bilaterally  Heart:  regular rate and rhythm  Abdomen: soft, nontender   Vulva: normal  Vagina: normal vagina  Cervix:  closed  Corpus: Well-involuted  Adnexa:  Non-palpable  Rectal Exam: no hemorrhoids        Assessment:   Postpartum exam 6 wks s/p SVD Pumping/ feeding Depression screening Contraception counseling   Plan:  : oral progesterone-only contraceptive, considering IUD.  Follow up in: 6 months for annual or earlier if needed  , , [name]

## 2020-07-09 NOTE — Patient Instructions (Signed)
Preventive Care 21-39 Years Old, Female Preventive care refers to lifestyle choices and visits with your health care provider that can promote health and wellness. This includes:  A yearly physical exam. This is also called an annual wellness visit.  Regular dental and eye exams.  Immunizations.  Screening for certain conditions.  Healthy lifestyle choices, such as: ? Eating a healthy diet. ? Getting regular exercise. ? Not using drugs or products that contain nicotine and tobacco. ? Limiting alcohol use. What can I expect for my preventive care visit? Physical exam Your health care provider may check your:  Height and weight. These may be used to calculate your BMI (body mass index). BMI is a measurement that tells if you are at a healthy weight.  Heart rate and blood pressure.  Body temperature.  Skin for abnormal spots. Counseling Your health care provider may ask you questions about your:  Past medical problems.  Family's medical history.  Alcohol, tobacco, and drug use.  Emotional well-being.  Home life and relationship well-being.  Sexual activity.  Diet, exercise, and sleep habits.  Work and work environment.  Access to firearms.  Method of birth control.  Menstrual cycle.  Pregnancy history. What immunizations do I need? Vaccines are usually given at various ages, according to a schedule. Your health care provider will recommend vaccines for you based on your age, medical history, and lifestyle or other factors, such as travel or where you work.   What tests do I need? Blood tests  Lipid and cholesterol levels. These may be checked every 5 years starting at age 20.  Hepatitis C test.  Hepatitis B test. Screening  Diabetes screening. This is done by checking your blood sugar (glucose) after you have not eaten for a while (fasting).  STD (sexually transmitted disease) testing, if you are at risk.  BRCA-related cancer screening. This may be  done if you have a family history of breast, ovarian, tubal, or peritoneal cancers.  Pelvic exam and Pap test. This may be done every 3 years starting at age 21. Starting at age 30, this may be done every 5 years if you have a Pap test in combination with an HPV test. Talk with your health care provider about your test results, treatment options, and if necessary, the need for more tests.   Follow these instructions at home: Eating and drinking  Eat a healthy diet that includes fresh fruits and vegetables, whole grains, lean protein, and low-fat dairy products.  Take vitamin and mineral supplements as recommended by your health care provider.  Do not drink alcohol if: ? Your health care provider tells you not to drink. ? You are pregnant, may be pregnant, or are planning to become pregnant.  If you drink alcohol: ? Limit how much you have to 0-1 drink a day. ? Be aware of how much alcohol is in your drink. In the [place] one drink equals one 12 oz bottle of beer (355 mL), one 5 oz glass of wine (148 mL), or one 1 oz glass of hard liquor (44 mL).   Lifestyle  Take daily care of your teeth and gums. Brush your teeth every morning and night with fluoride toothpaste. Floss one time each day.  Stay active. Exercise for at least 30 minutes 5 or more days each week.  Do not use any products that contain nicotine or tobacco, such as cigarettes, e-cigarettes, and chewing tobacco. If you need help quitting, ask your health care provider.  Do not   use drugs.  If you are sexually active, practice safe sex. Use a condom or other form of protection to prevent STIs (sexually transmitted infections).  If you do not wish to become pregnant, use a form of birth control. If you plan to become pregnant, see your health care provider for a prepregnancy visit.  Find healthy ways to cope with stress, such as: ? Meditation, yoga, or listening to music. ? Journaling. ? Talking to a trusted  person. ? Spending time with friends and family. Safety  Always wear your seat belt while driving or riding in a vehicle.  Do not drive: ? If you have been drinking alcohol. Do not ride with someone who has been drinking. ? When you are tired or distracted. ? While texting.  Wear a helmet and other protective equipment during sports activities.  If you have firearms in your house, make sure you follow all gun safety procedures.  Seek help if you have been physically or sexually abused. What's next?  Go to your health care provider once a year for an annual wellness visit.  Ask your health care provider how often you should have your eyes and teeth checked.  Stay up to date on all vaccines. This information is not intended to replace advice given to you by your health care provider. Make sure you discuss any questions you have with your health care provider. Document Revised: 01/28/2020 Document Reviewed: 02/10/2018 Elsevier Patient Education  2021 Elsevier Inc.  

## 2020-08-28 ENCOUNTER — Other Ambulatory Visit: Payer: Self-pay | Admitting: Certified Nurse Midwife

## 2020-08-28 MED ORDER — DICLOXACILLIN SODIUM 500 MG PO CAPS: 500.0000 mg | ORAL_CAPSULE | Freq: Four times a day (QID) | ORAL | 0 refills | Status: AC

## 2021-10-20 ENCOUNTER — Encounter: Payer: Self-pay | Admitting: Emergency Medicine

## 2021-10-20 ENCOUNTER — Ambulatory Visit
Admission: EM | Admit: 2021-10-20 | Discharge: 2021-10-20 | Disposition: A | Discharge status: Home / Self Care | Payer: No Typology Code available for payment source | Attending: Family Medicine | Admitting: Family Medicine

## 2021-10-20 DIAGNOSIS — N390 Urinary tract infection, site not specified: Secondary | ICD-10-CM | POA: Diagnosis present

## 2021-10-20 LAB — POCT URINALYSIS DIP (MANUAL ENTRY)
Bilirubin, UA: NEGATIVE
Glucose, UA: 100 mg/dL — AB
Ketones, POC UA: NEGATIVE mg/dL
Nitrite, UA: POSITIVE — AB
Protein Ur, POC: NEGATIVE mg/dL
Spec Grav, UA: <=1.005 — AB (ref 1.010–1.025)
Urobilinogen, UA: 0.2 E.U./dL
pH, UA: 5.5 (ref 5.0–8.0)

## 2021-10-20 LAB — POCT URINE PREGNANCY: Preg Test, Ur: NEGATIVE

## 2021-10-20 MED ORDER — NITROFURANTOIN MONOHYD MACRO 100 MG PO CAPS: 100.0000 mg | ORAL_CAPSULE | Freq: Two times a day (BID) | ORAL | 0 refills | Status: AC
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
## 2021-10-20 NOTE — ED Triage Notes (Signed)
Pt presents with dysuria and urinary frequency since yesterday.  

## 2021-10-22 LAB — URINE CULTURE
Culture: 30000 — AB
Special Requests: NORMAL

## 2021-11-05 ENCOUNTER — Ambulatory Visit (INDEPENDENT_AMBULATORY_CARE_PROVIDER_SITE_OTHER): Payer: No Typology Code available for payment source | Admitting: Certified Nurse Midwife

## 2021-11-05 ENCOUNTER — Encounter: Payer: Self-pay | Admitting: Certified Nurse Midwife

## 2021-11-05 ENCOUNTER — Encounter: Payer: No Typology Code available for payment source | Admitting: Certified Nurse Midwife

## 2021-11-05 VITALS — BP 135/76 | HR 71 | Temp 98.2°F | Ht 68.0 in

## 2021-11-05 DIAGNOSIS — R399 Unspecified symptoms and signs involving the genitourinary system: Secondary | ICD-10-CM | POA: Diagnosis not present

## 2021-11-05 LAB — POCT URINALYSIS DIPSTICK
Bilirubin, UA: NEGATIVE
Blood, UA: NEGATIVE
Glucose, UA: NEGATIVE
Ketones, UA: NEGATIVE
Leukocytes, UA: NEGATIVE
Nitrite, UA: NEGATIVE
Protein, UA: NEGATIVE
Spec Grav, UA: 1.015 (ref 1.010–1.025)
Urobilinogen, UA: 0.2 E.U./dL
pH, UA: 6 (ref 5.0–8.0)

## 2021-11-05 NOTE — Progress Notes (Signed)
Subjective:    Natalie Ray is a 41 y.o. female who complains of frequency and tingling  for a few days. She was treated for UTI at the beginning of month and symptoms got better but then returned a few days after completion of antibiotics.  Patient denies back pain and fever.  Patient does have a history of recurrent UTI.  Patient does not have a history of pyelonephritis. The following portions of the patient's history were reviewed and updated as appropriate: allergies, current medications, past family history, past medical history, past social history, past surgical history, and problem list. Review of Systems Pertinent items are noted in HPI.    Objective:    There were no vitals taken for this visit. General: alert, cooperative, and appears stated age  Abdomen: soft, non-tender, without masses or organomegaly   Back: CVA tenderness absent  GU: defer exam   Laboratory:  Urine dipstick shows trace for leukocyte esterase.      Assessment:    Urinary frequency Pain with urination    Plan: Plan:    1. Medications: not indicated at this time, pt instructed to take Azo until Culture results. If positive will do 1 g rocephin as well as oral antibiotics. 2. Maintain adequate hydration 3. Follow up if symptoms not improving, and prn.   [name], CNM

## 2021-11-05 NOTE — Patient Instructions (Signed)
Urinary Tract Infection, Adult A urinary tract infection (UTI) is an infection of any part of the urinary tract. The urinary tract includes: The kidneys. The ureters. The bladder. The urethra. These organs make, store, and get rid of pee (urine) in the body. What are the causes? This infection is caused by germs (bacteria) in your genital area. These germs grow and cause swelling (inflammation) of your urinary tract. What increases the risk? The following factors may make you more likely to develop this condition: Using a small, thin tube (catheter) to drain pee. Not being able to control when you pee or poop (incontinence). Being female. If you are female, these things can increase the risk: Using these methods to prevent pregnancy: A medicine that kills sperm (spermicide). A device that blocks sperm (diaphragm). Having low levels of a female hormone (estrogen). Being pregnant. You are more likely to develop this condition if: You have genes that add to your risk. You are sexually active. You take antibiotic medicines. You have trouble peeing because of: A prostate that is bigger than normal, if you are female. A blockage in the part of your body that drains pee from the bladder. A kidney stone. A nerve condition that affects your bladder. Not getting enough to drink. Not peeing often enough. You have other conditions, such as: Diabetes. A weak disease-fighting system (immune system). Sickle cell disease. Gout. Injury of the spine. What are the signs or symptoms? Symptoms of this condition include: Needing to pee right away. Peeing small amounts often. Pain or burning when peeing. Blood in the pee. Pee that smells bad or not like normal. Trouble peeing. Pee that is cloudy. Fluid coming from the vagina, if you are female. Pain in the belly or lower back. Other symptoms include: Vomiting. Not feeling hungry. Feeling mixed up (confused). This may be the first symptom in  older adults. Being tired and grouchy (irritable). A fever. Watery poop (diarrhea). How is this treated? Taking antibiotic medicine. Taking other medicines. Drinking enough water. In some cases, you may need to see a specialist. Follow these instructions at home:  Medicines Take over-the-counter and prescription medicines only as told by your doctor. If you were prescribed an antibiotic medicine, take it as told by your doctor. Do not stop taking it even if you start to feel better. General instructions Make sure you: Pee until your bladder is empty. Do not hold pee for a long time. Empty your bladder after sex. Wipe from front to back after peeing or pooping if you are a female. Use each tissue one time when you wipe. Drink enough fluid to keep your pee pale yellow. Keep all follow-up visits. Contact a doctor if: You do not get better after 1-2 days. Your symptoms go away and then come back. Get help right away if: You have very bad back pain. You have very bad pain in your lower belly. You have a fever. You have chills. You feeling like you will vomit or you vomit. Summary A urinary tract infection (UTI) is an infection of any part of the urinary tract. This condition is caused by germs in your genital area. There are many risk factors for a UTI. Treatment includes antibiotic medicines. Drink enough fluid to keep your pee pale yellow. This information is not intended to replace advice given to you by your health care provider. Make sure you discuss any questions you have with your health care provider. Document Revised: 01/12/2020 Document Reviewed: 01/12/2020 Elsevier Patient Education    2023 Elsevier Inc.  

## 2021-11-07 ENCOUNTER — Encounter: Payer: Self-pay | Admitting: Certified Nurse Midwife

## 2021-11-07 LAB — URINE CULTURE: Organism ID, Bacteria: NO GROWTH

## 2022-03-27 ENCOUNTER — Encounter: Payer: Self-pay | Admitting: Certified Nurse Midwife

## 2022-04-24 ENCOUNTER — Encounter: Payer: Self-pay | Admitting: Certified Nurse Midwife

## 2022-04-27 ENCOUNTER — Ambulatory Visit (INDEPENDENT_AMBULATORY_CARE_PROVIDER_SITE_OTHER): Payer: No Typology Code available for payment source | Admitting: Certified Nurse Midwife

## 2022-04-27 VITALS — BP 111/78 | HR 67 | Ht 69.6 in | Wt 223.2 lb

## 2022-04-27 DIAGNOSIS — R350 Frequency of micturition: Secondary | ICD-10-CM | POA: Diagnosis not present

## 2022-04-27 DIAGNOSIS — R300 Dysuria: Secondary | ICD-10-CM | POA: Diagnosis not present

## 2022-04-27 DIAGNOSIS — R399 Unspecified symptoms and signs involving the genitourinary system: Secondary | ICD-10-CM | POA: Diagnosis not present

## 2022-04-27 LAB — POCT URINALYSIS DIPSTICK
Bilirubin, UA: NEGATIVE
Blood, UA: NEGATIVE
Glucose, UA: NEGATIVE
Ketones, UA: NEGATIVE
Leukocytes, UA: NEGATIVE
Nitrite, UA: NEGATIVE
Protein, UA: NEGATIVE
Spec Grav, UA: 1.015 (ref 1.010–1.025)
Urobilinogen, UA: 0.2 E.U./dL
pH, UA: 6 (ref 5.0–8.0)

## 2022-04-27 NOTE — Progress Notes (Signed)
Patient presents today due to UTI symptoms. She states dysuria, urinary frequency. Urine dipstick preformed, dipstick was negative. Per office protocol urine culture sent. Discussed continuing to increase fluids and use tylenol as needed. Patient states no other concerns at this time. All questions answered.

## 2022-04-29 ENCOUNTER — Encounter: Payer: Self-pay | Admitting: Certified Nurse Midwife

## 2022-04-29 LAB — URINE CULTURE

## 2022-08-17 ENCOUNTER — Encounter: Payer: Self-pay | Admitting: Certified Nurse Midwife

## 2023-09-22 ENCOUNTER — Ambulatory Visit (INDEPENDENT_AMBULATORY_CARE_PROVIDER_SITE_OTHER): Admitting: Certified Nurse Midwife

## 2023-09-22 ENCOUNTER — Encounter: Payer: Self-pay | Admitting: Certified Nurse Midwife

## 2023-09-22 ENCOUNTER — Other Ambulatory Visit (HOSPITAL_COMMUNITY)
Admission: RE | Admit: 2023-09-22 | Discharge: 2023-09-22 | Disposition: A | Discharge status: Home / Self Care | Source: Ambulatory Visit | Attending: Certified Nurse Midwife | Admitting: Certified Nurse Midwife

## 2023-09-22 VITALS — BP 119/87 | HR 77 | Ht 68.0 in | Wt 238.4 lb

## 2023-09-22 DIAGNOSIS — E669 Obesity, unspecified: Secondary | ICD-10-CM

## 2023-09-22 DIAGNOSIS — Z01419 Encounter for gynecological examination (general) (routine) without abnormal findings: Secondary | ICD-10-CM | POA: Diagnosis not present

## 2023-09-22 DIAGNOSIS — Z13220 Encounter for screening for lipoid disorders: Secondary | ICD-10-CM

## 2023-09-22 DIAGNOSIS — Z1231 Encounter for screening mammogram for malignant neoplasm of breast: Secondary | ICD-10-CM

## 2023-09-22 DIAGNOSIS — Z124 Encounter for screening for malignant neoplasm of cervix: Secondary | ICD-10-CM | POA: Diagnosis present

## 2023-09-22 DIAGNOSIS — Z130 Encounter for screening for diseases of the blood and blood-forming organs and certain disorders involving the immune mechanism: Secondary | ICD-10-CM

## 2023-09-22 DIAGNOSIS — Z131 Encounter for screening for diabetes mellitus: Secondary | ICD-10-CM
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
## 2023-09-28 LAB — LIPID PANEL
Chol/HDL Ratio: 6.6 ratio — ABNORMAL HIGH (ref 0.0–4.4)
Cholesterol, Total: 270 mg/dL — ABNORMAL HIGH (ref 100–199)
HDL: 41 mg/dL (ref >39)
LDL Chol Calc (NIH): 157 mg/dL — ABNORMAL HIGH (ref 0–99)
Triglycerides: 380 mg/dL — ABNORMAL HIGH (ref 0–149)
VLDL Cholesterol Cal: 72 mg/dL — ABNORMAL HIGH (ref 5–40)

## 2023-09-28 LAB — CBC
Hematocrit: 42 % (ref 34.0–46.6)
Hemoglobin: 14.4 g/dL (ref 11.1–15.9)
MCH: 30.5 pg (ref 26.6–33.0)
MCHC: 34.3 g/dL (ref 31.5–35.7)
MCV: 89 fL (ref 79–97)
Platelets: 303 10*3/uL (ref 150–450)
RBC: 4.72 x10E6/uL (ref 3.77–5.28)
RDW: 12.6 % (ref 11.7–15.4)
WBC: 11.1 10*3/uL — ABNORMAL HIGH (ref 3.4–10.8)

## 2023-09-28 LAB — HEMOGLOBIN A1C
Est. average glucose Bld gHb Est-mCnc: 114 mg/dL
Hgb A1c MFr Bld: 5.6 % (ref 4.8–5.6)

## 2023-09-28 LAB — TSH+FREE T4
Free T4: 0.95 ng/dL (ref 0.82–1.77)
TSH: 1.92 u[IU]/mL (ref 0.450–4.500)

## 2023-09-29 ENCOUNTER — Encounter: Payer: Self-pay | Admitting: Certified Nurse Midwife

## 2023-09-29 LAB — CYTOLOGY - PAP
Comment: NEGATIVE
Comment: NEGATIVE
Comment: NEGATIVE
Diagnosis: NEGATIVE
HPV 16: NEGATIVE
HPV 18 / 45: NEGATIVE
High risk HPV: POSITIVE — AB

## 2023-10-25 ENCOUNTER — Encounter: Payer: Self-pay | Admitting: Certified Nurse Midwife

## 2023-10-26 ENCOUNTER — Ambulatory Visit
Admission: RE | Admit: 2023-10-26 | Discharge: 2023-10-26 | Disposition: A | Discharge status: Home / Self Care | Source: Ambulatory Visit | Attending: Certified Nurse Midwife | Admitting: Certified Nurse Midwife

## 2023-10-26 ENCOUNTER — Ambulatory Visit
Admission: RE | Admit: 2023-10-26 | Discharge: 2023-10-26 | Disposition: A | Discharge status: Home / Self Care | Source: Ambulatory Visit | Attending: Certified Nurse Midwife

## 2023-10-26 ENCOUNTER — Encounter: Payer: Self-pay | Admitting: Certified Nurse Midwife

## 2023-10-26 ENCOUNTER — Other Ambulatory Visit: Payer: Self-pay | Admitting: Certified Nurse Midwife

## 2023-10-26 ENCOUNTER — Other Ambulatory Visit

## 2023-10-26 ENCOUNTER — Ambulatory Visit (INDEPENDENT_AMBULATORY_CARE_PROVIDER_SITE_OTHER): Admitting: Certified Nurse Midwife

## 2023-10-26 VITALS — BP 122/75 | HR 73 | Wt 238.0 lb

## 2023-10-26 DIAGNOSIS — N63 Unspecified lump in unspecified breast: Secondary | ICD-10-CM | POA: Diagnosis present

## 2023-10-26 DIAGNOSIS — R928 Other abnormal and inconclusive findings on diagnostic imaging of breast: Secondary | ICD-10-CM | POA: Diagnosis present

## 2023-10-26 MED ORDER — DICLOXACILLIN SODIUM 500 MG PO CAPS: 500.0000 mg | ORAL_CAPSULE | Freq: Four times a day (QID) | ORAL | 0 refills | Status: AC

## 2023-10-26 NOTE — Addendum Note (Signed)
 Addended by: Martene Skye on: 10/26/2023 12:01 PM   Modules accepted: Orders

## 2023-10-26 NOTE — Progress Notes (Signed)
 Subjective:     Natalie Ray is an 43 y.o. female who presents for evaluation of a breast mass. Change was noted on Friday , she had a firm area that has gottlen larger. It is tender to touch and is red  and has gradually worsening since first identified. She states it feels like a blocked duct.  She denies any trama to the area. Patient does routinely do self breast exams. The mass is tender. Patient admits to nipple discharge. Breast cancer risk factors include: none.  The following portions of the patient's history were reviewed and updated as appropriate: allergies, current medications, past family history, past medical history, past social history, past surgical history, and problem list.  Review of Systems Pertinent items are noted in HPI.     Objective:    Breasts: left breast normal without mass, skin or nipple changes or axillary nodes.   Right breast redness noted 6x 6cm . Mas noted at 9 oclock at nipple 5 x 5 cm firm tender, discharge with manipulation serosanguinous in color. No swelling of lymph nodes noted.    Assessment:    breast mass and possible blocked duct    Plan:    Aerobic/anaerobic culture collected. CBC and prolactin ordered. Diagnostic right ultrasound and mammogram ordered.    Pt encouraged to take tylenol  /ibuprofen  as needed. Warm compresses for comfort. Will follow up with results.   Alise Appl, CNM

## 2023-10-27 LAB — CBC
Hematocrit: 41.2 % (ref 34.0–46.6)
Hemoglobin: 13.6 g/dL (ref 11.1–15.9)
MCH: 29.8 pg (ref 26.6–33.0)
MCHC: 33 g/dL (ref 31.5–35.7)
MCV: 90 fL (ref 79–97)
Platelets: 299 10*3/uL (ref 150–450)
RBC: 4.57 x10E6/uL (ref 3.77–5.28)
RDW: 12.4 % (ref 11.7–15.4)
WBC: 9.2 10*3/uL (ref 3.4–10.8)

## 2023-10-27 LAB — PROLACTIN: Prolactin: 7.9 ng/mL (ref 4.8–33.4)

## 2023-10-29 ENCOUNTER — Encounter: Payer: Self-pay | Admitting: Certified Nurse Midwife

## 2023-10-31 LAB — ANAEROBIC AND AEROBIC CULTURE

## 2023-11-01 ENCOUNTER — Encounter: Payer: Self-pay | Admitting: Certified Nurse Midwife

## 2023-11-03 ENCOUNTER — Encounter: Payer: Self-pay | Admitting: Certified Nurse Midwife

## 2023-11-03 DIAGNOSIS — N6310 Unspecified lump in the right breast, unspecified quadrant: Secondary | ICD-10-CM

## 2023-11-03 DIAGNOSIS — R928 Other abnormal and inconclusive findings on diagnostic imaging of breast: Secondary | ICD-10-CM

## 2023-11-09 ENCOUNTER — Other Ambulatory Visit: Payer: Self-pay | Admitting: Certified Nurse Midwife

## 2023-11-09 DIAGNOSIS — N6489 Other specified disorders of breast: Secondary | ICD-10-CM

## 2023-11-09 DIAGNOSIS — R928 Other abnormal and inconclusive findings on diagnostic imaging of breast: Secondary | ICD-10-CM

## 2023-11-15 ENCOUNTER — Encounter: Payer: Self-pay | Admitting: Certified Nurse Midwife
